# Patient Record
Sex: Female | Born: 1990 | Race: Black or African American | Hispanic: No | Marital: Single | Smoking: Former smoker
Health system: Southern US, Community
[De-identification: ages and names within clinical notes are randomized; demographics above are authoritative.]

## PROBLEM LIST (undated history)

## (undated) ENCOUNTER — Inpatient Hospital Stay (HOSPITAL_COMMUNITY): Payer: Self-pay

## (undated) DIAGNOSIS — N390 Urinary tract infection, site not specified: Secondary | ICD-10-CM

## (undated) DIAGNOSIS — L732 Hidradenitis suppurativa: Secondary | ICD-10-CM

## (undated) DIAGNOSIS — A599 Trichomoniasis, unspecified: Secondary | ICD-10-CM

## (undated) DIAGNOSIS — A749 Chlamydial infection, unspecified: Secondary | ICD-10-CM

## (undated) HISTORY — PX: WISDOM TOOTH EXTRACTION: SHX21

---
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
## 2009-04-09 ENCOUNTER — Emergency Department (HOSPITAL_COMMUNITY): Admission: EM | Admit: 2009-04-09 | Discharge: 2009-04-10 | Payer: Self-pay | Admitting: Emergency Medicine

## 2009-09-23 ENCOUNTER — Ambulatory Visit: Payer: Self-pay | Admitting: Gynecology

## 2009-09-25 ENCOUNTER — Inpatient Hospital Stay (HOSPITAL_COMMUNITY): Admission: AD | Admit: 2009-09-25 | Discharge: 2009-09-25 | Payer: Self-pay | Admitting: Obstetrics & Gynecology

## 2009-09-25 ENCOUNTER — Ambulatory Visit: Payer: Self-pay | Admitting: Gynecology

## 2010-02-11 ENCOUNTER — Inpatient Hospital Stay (HOSPITAL_COMMUNITY): Admission: AD | Admit: 2010-02-11 | Discharge: 2009-09-23 | Payer: Self-pay | Admitting: Family Medicine

## 2010-04-30 ENCOUNTER — Inpatient Hospital Stay (INDEPENDENT_AMBULATORY_CARE_PROVIDER_SITE_OTHER)
Admission: RE | Admit: 2010-04-30 | Discharge: 2010-04-30 | Disposition: A | Discharge status: Home / Self Care | Payer: PRIVATE HEALTH INSURANCE | Source: Ambulatory Visit | Attending: Family Medicine | Admitting: Family Medicine

## 2010-04-30 DIAGNOSIS — R300 Dysuria: Secondary | ICD-10-CM

## 2010-04-30 LAB — POCT PREGNANCY, URINE: Preg Test, Ur: NEGATIVE

## 2010-04-30 LAB — POCT URINALYSIS DIPSTICK
Protein, ur: 30 mg/dL — AB
Specific Gravity, Urine: >=1.03 (ref 1.005–1.030)
pH: 6 (ref 5.0–8.0)

## 2010-05-22 LAB — HCG, QUANTITATIVE, PREGNANCY: hCG, Beta Chain, Quant, S: 132 m[IU]/mL — ABNORMAL HIGH (ref <5)

## 2010-05-22 LAB — CBC
Hemoglobin: 13.4 g/dL (ref 12.0–15.0)
MCHC: 32.6 g/dL (ref 30.0–36.0)
Platelets: 223 10*3/uL (ref 150–400)
RDW: 14.3 % (ref 11.5–15.5)

## 2010-05-22 LAB — GC/CHLAMYDIA PROBE AMP, GENITAL: GC Probe Amp, Genital: NEGATIVE

## 2010-05-22 LAB — WET PREP, GENITAL: Trich, Wet Prep: NONE SEEN

## 2010-05-22 LAB — ABO/RH: ABO/RH(D): A POS

## 2011-02-07 ENCOUNTER — Inpatient Hospital Stay (HOSPITAL_COMMUNITY)
Admission: AD | Admit: 2011-02-07 | Discharge: 2011-02-07 | Disposition: A | Discharge status: Home / Self Care | Payer: PRIVATE HEALTH INSURANCE | Source: Ambulatory Visit | Attending: Obstetrics & Gynecology | Admitting: Obstetrics & Gynecology

## 2011-02-07 DIAGNOSIS — O99891 Other specified diseases and conditions complicating pregnancy: Secondary | ICD-10-CM | POA: Insufficient documentation

## 2011-02-07 DIAGNOSIS — Z3201 Encounter for pregnancy test, result positive: Secondary | ICD-10-CM

## 2011-02-07 NOTE — ED Provider Notes (Signed)
History     No chief complaint on file.  HPI Pt here requesting pregnancy verification only. No pain or bleeding. No c/o.    History  Substance Use Topics  . Smoking status: Not on file  . Smokeless tobacco: Not on file  . Alcohol Use: Not on file    Allergies: Allergies not on file  No prescriptions prior to admission    Review of Systems  Constitutional: Negative.   Respiratory: Negative.   Cardiovascular: Negative.   Gastrointestinal: Negative for nausea, vomiting, abdominal pain, diarrhea and constipation.  Genitourinary: Negative for dysuria, urgency, frequency, hematuria and flank pain.       Negative for vaginal bleeding, vaginal discharge, dyspareunia  Musculoskeletal: Negative.   Neurological: Negative.   Psychiatric/Behavioral: Negative.    Physical Exam   There were no vitals taken for this visit.  Physical Exam  Nursing note and vitals reviewed. Constitutional: She is oriented to person, place, and time. She appears well-developed.  Cardiovascular: Normal rate.   Respiratory: Effort normal.  Musculoskeletal: Normal range of motion.  Neurological: She is alert and oriented to person, place, and time.  Skin: Skin is warm and dry.  Psychiatric: She has a normal mood and affect.    MAU Course  Procedures  UPT positive  Assessment and Plan  Positive pregnancy test - verification given, start prenatal care ASAP, precautions rev'd  FRAZIER,NATALIE 02/07/2011, 7:33 PM

## 2011-02-16 NOTE — ED Provider Notes (Signed)
Agree with above note.  Christina Olsen H. 02/16/2011 12:44 PM

## 2011-02-26 ENCOUNTER — Encounter (HOSPITAL_COMMUNITY): Payer: Self-pay | Admitting: *Deleted

## 2011-02-26 ENCOUNTER — Inpatient Hospital Stay (HOSPITAL_COMMUNITY): Payer: PRIVATE HEALTH INSURANCE

## 2011-02-26 ENCOUNTER — Inpatient Hospital Stay (HOSPITAL_COMMUNITY)
Admission: AD | Admit: 2011-02-26 | Discharge: 2011-02-26 | Disposition: A | Discharge status: Home / Self Care | Payer: PRIVATE HEALTH INSURANCE | Source: Ambulatory Visit | Attending: Obstetrics and Gynecology | Admitting: Obstetrics and Gynecology

## 2011-02-26 DIAGNOSIS — O200 Threatened abortion: Secondary | ICD-10-CM

## 2011-02-26 LAB — ABO/RH: ABO/RH(D): A POS

## 2011-02-26 LAB — URINALYSIS, ROUTINE W REFLEX MICROSCOPIC
Glucose, UA: NEGATIVE mg/dL
Ketones, ur: NEGATIVE mg/dL
Leukocytes, UA: NEGATIVE
Nitrite: NEGATIVE

## 2011-02-26 LAB — URINE MICROSCOPIC-ADD ON

## 2011-02-26 LAB — CBC
MCH: 26.5 pg (ref 26.0–34.0)
MCHC: 34.2 g/dL (ref 30.0–36.0)
Platelets: 210 10*3/uL (ref 150–400)

## 2011-02-26 LAB — POCT PREGNANCY, URINE: Preg Test, Ur: POSITIVE

## 2011-02-26 LAB — HCG, QUANTITATIVE, PREGNANCY: hCG, Beta Chain, Quant, S: 9267 m[IU]/mL — ABNORMAL HIGH (ref <5)

## 2011-02-26 NOTE — Progress Notes (Signed)
Pt had pap smear on 12/20 and started spotting. Denies pain or discomfort.

## 2011-02-26 NOTE — ED Provider Notes (Signed)
History     CSN: [id number]  Arrival date & time 02/26/11  1627   None     Chief Complaint  Patient presents with  . Vaginal Bleeding    HPI Christina Olsen is a 20 y.o. female @ [redacted]w[redacted]d gestation who presents to MAU for vaginal bleeding. [facility] and had a pap smear and cultures done 2 days ago. Yesterday noted spotting and today the bleeding increased. She denies pain, nausea vomiting, UTI symptoms or other problems.  No past medical history on file.  No past surgical history on file.  Family History  Problem Relation Age of Onset  . Hypertension Mother     History  Substance Use Topics  . Smoking status: Former Games developer  . Smokeless tobacco: Not on file  . Alcohol Use: Yes     occasional mixed drinks     OB History    Grav Para Term Preterm Abortions TAB SAB Ect Mult Living   2    1  1          Review of Systems  Genitourinary: Positive for vaginal bleeding.       Pregnant   All other systems reviewed and are negative.    Allergies  Shellfish-derived products and Sulfa antibiotics  Home Medications  No current outpatient prescriptions on file.  BP 133/76  Pulse 88  Temp(Src) 98.1 F (36.7 C) (Oral)  Resp 18  Ht 5\' 6"  (1.676 m)  Wt 215 lb 9.6 oz (97.796 kg)  BMI 34.80 kg/m2  LMP 12/16/2010  Physical Exam  Nursing note and vitals reviewed. Constitutional: She is oriented to person, place, and time. She appears well-developed and well-nourished.  HENT:  Head: Normocephalic.  Eyes: EOM are normal.  Neck: Neck supple.  Cardiovascular: Normal rate.   Pulmonary/Chest: Effort normal.  Abdominal: Soft. There is no tenderness.  Genitourinary:       External genitalia without lesions. Small amount of blood vaginal vault. Cervix long, closed, no CMT, no adnexal tenderness or mass palpated. Uterus slightly enlarged.  Musculoskeletal: Normal range of motion.  Neurological: She is alert and  oriented to person, place, and time. No cranial nerve deficit.  Skin: Skin is warm and dry.  Psychiatric: She has a normal mood and affect. Her behavior is normal. Judgment and thought content normal.   Results for orders placed during the hospital encounter of 02/26/11 (from the past 24 hour(s))  URINALYSIS, ROUTINE W REFLEX MICROSCOPIC     Status: Abnormal   Collection Time   02/26/11  4:50 PM      Component Value Range   Color, Urine YELLOW  YELLOW    APPearance HAZY (*) CLEAR    Specific Gravity, Urine 1.025  1.005 - 1.030    pH 6.0  5.0 - 8.0    Glucose, UA NEGATIVE  NEGATIVE (mg/dL)   Hgb urine dipstick LARGE (*) NEGATIVE    Bilirubin Urine NEGATIVE  NEGATIVE    Ketones, ur NEGATIVE  NEGATIVE (mg/dL)   Protein, ur NEGATIVE  NEGATIVE (mg/dL)   Urobilinogen, UA 0.2  0.0 - 1.0 (mg/dL)   Nitrite NEGATIVE  NEGATIVE    Leukocytes, UA NEGATIVE  NEGATIVE   URINE MICROSCOPIC-ADD ON     Status: Abnormal   Collection Time   02/26/11  4:50 PM      Component Value Range   Squamous Epithelial / LPF FEW (*) RARE    WBC, UA 3-6  <3 (  WBC/hpf)   RBC / HPF TOO NUMEROUS TO COUNT  <3 (RBC/hpf)   Bacteria, UA FEW (*) RARE   POCT PREGNANCY, URINE     Status: Normal   Collection Time   02/26/11  5:00 PM      Component Value Range   Preg Test, Ur POSITIVE    HCG, QUANTITATIVE, PREGNANCY     Status: Abnormal   Collection Time   02/26/11  5:43 PM      Component Value Range   hCG, Beta Chain, Quant, S 9267 (*) <5 (mIU/mL)  ABO/RH     Status: Normal   Collection Time   02/26/11  5:43 PM      Component Value Range   ABO/RH(D) A POS    CBC     Status: Abnormal   Collection Time   02/26/11  5:43 PM      Component Value Range   WBC 13.6 (*) 4.0 - 10.5 (K/uL)   RBC 5.02  3.87 - 5.11 (MIL/uL)   Hemoglobin 13.3  12.0 - 15.0 (g/dL)   HCT 16.1  09.6 - 04.5 (%)   MCV 77.5 (*) 78.0 - 100.0 (fL)   MCH 26.5  26.0 - 34.0 (pg)   MCHC 34.2  30.0 - 36.0 (g/dL)   RDW 40.9  81.1 - 91.4 (%)   Platelets  210  150 - 400 (K/uL)   US Ob Comp Less 14 Wks  02/26/2011  *RADIOLOGY REPORT*  Clinical Data: bleeding,spotting; ;  OBSTETRIC <14 WK [place] AND TRANSVAGINAL OB US  Technique: Both transabdominal and transvaginal ultrasound examinations were performed for complete evaluation of the gestation as well as the maternal uterus, adnexal regions, and pelvic cul-de-sac.  Comparison: None.  Findings: Single intrauterine gestation, 6 weeks 6 days by crown- rump length.  No fetal heart tones can be detected.  No subchorionic hemorrhage.  Ovaries are symmetric in size and echotexture.  No adnexal masses.  IMPRESSION: 6-week-6-day intrauterine gestation.  However, no fetal tones can be detected.  Findings concerning for fetal demise.  Recommend close clinical follow-up as well as serial quantitative beta HCGs and ultrasounds.  Original Report Authenticated By: Cyndie Chime, M.D.   US Ob Transvaginal  02/26/2011  *RADIOLOGY REPORT*  Clinical Data: bleeding,spotting; ;  OBSTETRIC <14 WK [place] AND TRANSVAGINAL OB US  Technique: Both transabdominal and transvaginal ultrasound examinations were performed for complete evaluation of the gestation as well as the maternal uterus, adnexal regions, and pelvic cul-de-sac.  Comparison: None.  Findings: Single intrauterine gestation, 6 weeks 6 days by crown- rump length.  No fetal heart tones can be detected.  No subchorionic hemorrhage.  Ovaries are symmetric in size and echotexture.  No adnexal masses.  IMPRESSION: 6-week-6-day intrauterine gestation.  However, no fetal tones can be detected.  Findings concerning for fetal demise.  Recommend close clinical follow-up as well as serial quantitative beta HCGs and ultrasounds.  Original Report Authenticated By: Cyndie Chime, M.D.   ED Course  Procedures Assessment: IUP @ 6.[redacted] weeks gestation without cardiac activity possible failed pregnancy  Plan:  Consult with Dr. Jolayne Panther   Patient to return to [facility] for follow up ultrasound  for viability   May return here as needed for problems.   MDM          Kerrie Buffalo, NP 02/26/11 [phone]

## 2011-02-27 NOTE — ED Provider Notes (Signed)
Agree with above note.  Christina Olsen 02/27/2011 7:46 AM

## 2011-02-28 ENCOUNTER — Inpatient Hospital Stay (HOSPITAL_COMMUNITY): Payer: PRIVATE HEALTH INSURANCE

## 2011-02-28 ENCOUNTER — Inpatient Hospital Stay (HOSPITAL_COMMUNITY)
Admission: AD | Admit: 2011-02-28 | Discharge: 2011-02-28 | Disposition: A | Discharge status: Home / Self Care | Payer: PRIVATE HEALTH INSURANCE | Source: Ambulatory Visit | Attending: Obstetrics & Gynecology | Admitting: Obstetrics & Gynecology

## 2011-02-28 ENCOUNTER — Encounter (HOSPITAL_COMMUNITY): Payer: Self-pay | Admitting: *Deleted

## 2011-02-28 ENCOUNTER — Other Ambulatory Visit: Payer: Self-pay | Admitting: Obstetrics & Gynecology

## 2011-02-28 DIAGNOSIS — O039 Complete or unspecified spontaneous abortion without complication: Secondary | ICD-10-CM | POA: Insufficient documentation

## 2011-02-28 LAB — CBC
Platelets: 217 10*3/uL (ref 150–400)
RBC: 4.94 MIL/uL (ref 3.87–5.11)
RDW: 14.5 % (ref 11.5–15.5)
WBC: 13.8 10*3/uL — ABNORMAL HIGH (ref 4.0–10.5)

## 2011-02-28 MED ORDER — OXYCODONE-ACETAMINOPHEN 5-325 MG PO TABS
2.0000 | ORAL_TABLET | Freq: Once | ORAL | Status: AC
  Administered 2011-02-28: 2 via ORAL
  Filled 2011-02-28: qty 2

## 2011-02-28 MED ORDER — OXYCODONE-ACETAMINOPHEN 5-325 MG PO TABS: 1.0000 | ORAL_TABLET | ORAL | Status: AC | PRN

## 2011-02-28 NOTE — Progress Notes (Signed)
Pt states has been having vaginal bleeding for past 3 days-has gotten heavier-as much as a period

## 2011-02-28 NOTE — ED Provider Notes (Signed)
History   Pt presents today c/o heavy vag bleeding in preg. Pt was seen 2 days prior for spotting in preg. She states she began having heavy bleeding and pain this am. She denies recent intercourse. She denies vag irritation, fever, or any other sx at this time.  Chief Complaint  Patient presents with  . Vaginal Bleeding   HPI  OB History    Grav Para Term Preterm Abortions TAB SAB Ect Mult Living   2    1  1          No past medical history on file.  No past surgical history on file.  Family History  Problem Relation Age of Onset  . Hypertension Mother     History  Substance Use Topics  . Smoking status: Former Games developer  . Smokeless tobacco: Never Used  . Alcohol Use: Yes     occasional mixed drinks     Allergies:  Allergies  Allergen Reactions  . Shellfish-Derived Products Anaphylaxis and Swelling  . Sulfa Antibiotics Other (See Comments)    Reaction unknown; told by mother she was allergic    Prescriptions prior to admission  Medication Sig Dispense Refill  . acetaminophen (TYLENOL) 325 MG tablet Take 162.5 mg by mouth every 6 (six) hours as needed. For pain       . diphenhydrAMINE (BENYLIN) 12.5 MG/5ML syrup Take 12.5 mg by mouth 4 (four) times daily as needed. For cough cold       . hydrocortisone butyrate (LUCOID) 0.1 % CREA cream Apply 1 application topically 2 (two) times daily. For rash       . prenatal vitamin w/FE, FA (PRENATAL 1 + 1) 27-1 MG TABS Take 1 tablet by mouth daily.          Review of Systems  Constitutional: Negative for fever.  Cardiovascular: Negative for chest pain.  Gastrointestinal: Positive for abdominal pain. Negative for nausea, vomiting, diarrhea and constipation.  Genitourinary: Negative for dysuria, urgency, frequency and hematuria.  Neurological: Negative for dizziness and headaches.  Psychiatric/Behavioral: Negative for depression and suicidal ideas.   Physical Exam   Blood pressure 113/88, pulse 100, temperature 97.5 F (36.4  C), temperature source Oral, resp. rate 20, height 5\' 6"  (1.676 m), weight 217 lb (98.431 kg), last menstrual period 12/16/2010, SpO2 99.00%.  Physical Exam  Nursing note and vitals reviewed. Constitutional: She is oriented to person, place, and time. She appears well-developed and well-nourished. No distress.  HENT:  Head: Normocephalic and atraumatic.  Eyes: EOM are normal. Pupils are equal, round, and reactive to light.  GI: Soft. She exhibits no distension. There is tenderness. There is no rebound and no guarding.  Genitourinary: There is bleeding around the vagina. No vaginal discharge found.       Moderate vag bleeding noted. Golf-ball size clot with what appeared to be POC removed from vagina without difficulty.  Neurological: She is alert and oriented to person, place, and time.  Skin: Skin is warm and dry. She is not diaphoretic.  Psychiatric: She has a normal mood and affect. Her behavior is normal. Judgment and thought content normal.    MAU Course  Procedures  Results for orders placed during the hospital encounter of 02/28/11 (from the past 24 hour(s))  CBC     Status: Abnormal   Collection Time   02/28/11  5:50 AM      Component Value Range   WBC 13.8 (*) 4.0 - 10.5 (K/uL)   RBC 4.94  3.87 - 5.11 (  MIL/uL)   Hemoglobin 13.1  12.0 - 15.0 (g/dL)   HCT 16.1  09.6 - 04.5 (%)   MCV 78.3  78.0 - 100.0 (fL)   MCH 26.5  26.0 - 34.0 (pg)   MCHC 33.9  30.0 - 36.0 (g/dL)   RDW 40.9  81.1 - 91.4 (%)   Platelets 217  150 - 400 (K/uL)   US Ob Comp Less 14 Wks  02/26/2011  *RADIOLOGY REPORT*  Clinical Data: bleeding,spotting; ;  OBSTETRIC <14 WK [place] AND TRANSVAGINAL OB US  Technique: Both transabdominal and transvaginal ultrasound examinations were performed for complete evaluation of the gestation as well as the maternal uterus, adnexal regions, and pelvic cul-de-sac.  Comparison: None.  Findings: Single intrauterine gestation, 6 weeks 6 days by crown- rump length.  No fetal heart  tones can be detected.  No subchorionic hemorrhage.  Ovaries are symmetric in size and echotexture.  No adnexal masses.  IMPRESSION: 6-week-6-day intrauterine gestation.  However, no fetal tones can be detected.  Findings concerning for fetal demise.  Recommend close clinical follow-up as well as serial quantitative beta HCGs and ultrasounds.  Original Report Authenticated By: Cyndie Chime, M.D.   US Ob Transvaginal  02/28/2011  *RADIOLOGY REPORT*  Clinical Data: Vaginal bleeding; assess for retained products of conception.  TRANSVAGINAL OBSTETRIC US  Technique:  Transvaginal ultrasound was performed for complete evaluation of the gestation as well as the maternal uterus, adnexal regions, and pelvic cul-de-sac.  Comparison:  Prior ultrasound of pregnancy performed 02/26/2011  Intrauterine gestational sac: None seen. Yolk sac: N/A Embryo: N/A Cardiac Activity: N/A  Maternal uterus/adnexae: The previously noted intrauterine gestational sac is no longer seen.  There is diffuse thickening of the endometrial echo complex to 1.8 cm, reflecting blood within the endometrial canal.  Minimal associated color Doppler blood flow is seen, and minimal retained products of conception cannot be excluded, though this likely remains within normal limits.  No free fluid is seen within the pelvic cul-de-sac.  The ovaries are not well characterized on this study; they were normal in appearance on the recent prior study.  IMPRESSION: The previously noted intrauterine gestational sac is no longer seen.  Blood noted filling the endometrial canal; minimal associated color Doppler blood flow is seen, and minimal retained products of conception cannot be excluded, though this likely remains within normal limits.  Original Report Authenticated By: Tonia Ghent, M.D.   US Ob Transvaginal  02/26/2011  *RADIOLOGY REPORT*  Clinical Data: bleeding,spotting; ;  OBSTETRIC <14 WK [place] AND TRANSVAGINAL OB US  Technique: Both transabdominal and  transvaginal ultrasound examinations were performed for complete evaluation of the gestation as well as the maternal uterus, adnexal regions, and pelvic cul-de-sac.  Comparison: None.  Findings: Single intrauterine gestation, 6 weeks 6 days by crown- rump length.  No fetal heart tones can be detected.  No subchorionic hemorrhage.  Ovaries are symmetric in size and echotexture.  No adnexal masses.  IMPRESSION: 6-week-6-day intrauterine gestation.  However, no fetal tones can be detected.  Findings concerning for fetal demise.  Recommend close clinical follow-up as well as serial quantitative beta HCGs and ultrasounds.  Original Report Authenticated By: Cyndie Chime, M.D.     Assessment and Plan  Complete AB: discussed with pt at length. She will f/u with her OB provider. Discussed diet, activity, risks, and precautions.  Clinton Gallant. Rice III, DrHSc, MPAS, PA-C  02/28/2011, 5:34 AM   Henrietta Hoover, PA 02/28/11 [phone]

## 2012-03-16 ENCOUNTER — Encounter (HOSPITAL_COMMUNITY): Payer: Self-pay

## 2012-03-16 ENCOUNTER — Inpatient Hospital Stay (HOSPITAL_COMMUNITY): Payer: Medicaid Other

## 2012-03-16 ENCOUNTER — Inpatient Hospital Stay (HOSPITAL_COMMUNITY)
Admission: AD | Admit: 2012-03-16 | Discharge: 2012-03-16 | Disposition: A | Discharge status: Home / Self Care | Payer: Medicaid Other | Source: Ambulatory Visit | Attending: Obstetrics & Gynecology | Admitting: Obstetrics & Gynecology

## 2012-03-16 DIAGNOSIS — O209 Hemorrhage in early pregnancy, unspecified: Secondary | ICD-10-CM

## 2012-03-16 LAB — CBC WITH DIFFERENTIAL/PLATELET
Eosinophils Absolute: 0.1 10*3/uL (ref 0.0–0.7)
Eosinophils Relative: 1 % (ref 0–5)
HCT: 37.7 % (ref 36.0–46.0)
Hemoglobin: 13.3 g/dL (ref 12.0–15.0)
Lymphs Abs: 2 10*3/uL (ref 0.7–4.0)
MCH: 27 pg (ref 26.0–34.0)
MCHC: 35.3 g/dL (ref 30.0–36.0)
MCV: 76.6 fL — ABNORMAL LOW (ref 78.0–100.0)
Monocytes Absolute: 0.7 10*3/uL (ref 0.1–1.0)
Monocytes Relative: 6 % (ref 3–12)
Neutrophils Relative %: 78 % — ABNORMAL HIGH (ref 43–77)
RBC: 4.92 MIL/uL (ref 3.87–5.11)

## 2012-03-16 LAB — WET PREP, GENITAL: Yeast Wet Prep HPF POC: NONE SEEN

## 2012-03-16 LAB — POCT PREGNANCY, URINE: Preg Test, Ur: POSITIVE — AB

## 2012-03-16 NOTE — MAU Provider Note (Signed)
History     CSN: [id number]  Arrival date and time: 03/16/12 1840   First Provider Initiated Contact with Patient 03/16/12 1922      Chief Complaint  Patient presents with  . Vaginal Bleeding   HPI ADDELINE Olsen is 22 y.o. G3P0020 [redacted]w[redacted]d weeks presenting with report of seeing a spot of blood on her panties this am.  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  She has 1 partner.  Not using contraception.  + UPT here.  Denies pain or abnormal discharge.    No past medical history on file.  No past surgical history on file.  Family History  Problem Relation Age of Onset  . Hypertension Mother     History  Substance Use Topics  . Smoking status: Former Games developer  . Smokeless tobacco: Never Used  . Alcohol Use: No     Comment: occasional mixed drinks     Allergies:  Allergies  Allergen Reactions  . Shellfish-Derived Products Anaphylaxis and Swelling  . Sulfa Antibiotics Other (See Comments)    Reaction unknown; told by mother she was allergic    Prescriptions prior to admission  Medication Sig Dispense Refill  . Prenatal Vit-Fe Fumarate-FA (PRENATAL MULTIVITAMIN) TABS Take 1 tablet by mouth daily.        Review of Systems  Constitutional: Negative.   Respiratory: Negative.   Cardiovascular: Negative.   Gastrointestinal: Negative for nausea, vomiting and abdominal pain.  Genitourinary:       + for vaginal bleeding Neg for abnormal discharge   Physical Exam   Blood pressure 109/94, pulse 90, temperature 98.2 F (36.8 C), temperature source Oral, resp. rate 16, height 5\' 5"  (1.651 m), weight 188 lb 3.2 oz (85.367 kg), last menstrual period 01/27/2012, SpO2 100.00%, unknown if currently breastfeeding.  Physical Exam  Constitutional: She is oriented to person, place, and time. She appears well-developed and well-nourished. No distress.  HENT:  Head: Normocephalic.  Neck: Normal range of motion.    Cardiovascular: Normal rate.   Respiratory: Effort normal.  GI: Soft. She exhibits no distension and no mass. There is no tenderness. There is no rebound and no guarding.  Genitourinary: Uterus is not tender. Enlarged: slightly. Cervix exhibits no motion tenderness, no discharge and no friability. Right adnexum displays no mass, no tenderness and no fullness. Left adnexum displays no mass, no tenderness and no fullness. No bleeding (for active bleeding) around the vagina. Vaginal discharge (brown mucous discharge without odor) found.  Neurological: She is alert and oriented to person, place, and time.  Skin: Skin is warm and dry.  Psychiatric: She has a normal mood and affect. Her behavior is normal.   Results for orders placed during the hospital encounter of 03/16/12 (from the past 24 hour(s))  POCT PREGNANCY, URINE     Status: Abnormal   Collection Time   03/16/12  7:10 PM      Component Value Range   Preg Test, Ur POSITIVE (*) NEGATIVE  CBC WITH DIFFERENTIAL     Status: Abnormal   Collection Time   03/16/12  7:33 PM      Component Value Range   WBC 12.3 (*) 4.0 - 10.5 K/uL   RBC 4.92  3.87 - 5.11 MIL/uL   Hemoglobin 13.3  12.0 - 15.0 g/dL   HCT 11.9  14.7 - 82.9 %   MCV 76.6 (*) 78.0 - 100.0 fL   MCH 27.0  26.0 - 34.0 pg   MCHC 35.3  30.0 - 36.0 g/dL   RDW 11.9  14.7 - 82.9 %   Platelets 201  150 - 400 K/uL   Neutrophils Relative 78 (*) 43 - 77 %   Neutro Abs 9.6 (*) 1.7 - 7.7 K/uL   Lymphocytes Relative 16  12 - 46 %   Lymphs Abs 2.0  0.7 - 4.0 K/uL   Monocytes Relative 6  3 - 12 %   Monocytes Absolute 0.7  0.1 - 1.0 K/uL   Eosinophils Relative 1  0 - 5 %   Eosinophils Absolute 0.1  0.0 - 0.7 K/uL   Basophils Relative 0  0 - 1 %   Basophils Absolute 0.0  0.0 - 0.1 K/uL  WET PREP, GENITAL     Status: Abnormal   Collection Time   03/16/12  7:36 PM      Component Value Range   Yeast Wet Prep HPF POC NONE SEEN  NONE SEEN   Trich, Wet Prep NONE SEEN  NONE SEEN   Clue Cells  Wet Prep HPF POC NONE SEEN  NONE SEEN   WBC, Wet Prep HPF POC FEW (*) NONE SEEN    BLOOD TYPE FROM PREVIOUS RECORD IS A POSITIVE  MAU Course  Procedures  GC/CHL culture to lab  MDM Care turned over to J. Vernie Ammons, PA Assessment and Plan   [phone] - Care assumed from Jeani Sow, NP   Assessment: IUP at 7w 1 d Small subchorionic hemorrhage  Plan: Discharge home Bleeding precautions discussed Will refer patient to [facility] clinic at [facility] Patient may return to MAU as needed  Freddi Starr, PA-C 03/16/2012 8:46 PM

## 2012-03-16 NOTE — MAU Note (Signed)
Patient states she has had a [facility] test [facility]. Started having pink spotting around 1500. No pain,

## 2012-03-17 LAB — GC/CHLAMYDIA PROBE AMP
CT Probe RNA: NEGATIVE
GC Probe RNA: NEGATIVE

## 2012-03-25 ENCOUNTER — Inpatient Hospital Stay (HOSPITAL_COMMUNITY): Payer: Medicaid Other

## 2012-03-25 ENCOUNTER — Encounter (HOSPITAL_COMMUNITY): Payer: Self-pay | Admitting: Obstetrics and Gynecology

## 2012-03-25 ENCOUNTER — Inpatient Hospital Stay (HOSPITAL_COMMUNITY)
Admission: AD | Admit: 2012-03-25 | Discharge: 2012-03-25 | Disposition: A | Discharge status: Home / Self Care | Payer: Medicaid Other | Source: Ambulatory Visit | Attending: Obstetrics & Gynecology | Admitting: Obstetrics & Gynecology

## 2012-03-25 DIAGNOSIS — O4690 Antepartum hemorrhage, unspecified, unspecified trimester: Secondary | ICD-10-CM

## 2012-03-25 DIAGNOSIS — O209 Hemorrhage in early pregnancy, unspecified: Secondary | ICD-10-CM | POA: Insufficient documentation

## 2012-03-25 NOTE — MAU Provider Note (Signed)
  History     CSN: [id number]  Arrival date and time: 03/25/12 4098   First Provider Initiated Contact with Patient 03/25/12 [phone]      Chief Complaint  Patient presents with  . Vaginal Bleeding   HPI  Pt is [redacted]w[redacted]d pregnant and presents with dark brown spotting this morning after going to the bathroom.  Pt was evaluated on 1/10 for same thing and was noted to have mod [facility] with viable [redacted]w[redacted]d IUP.  Pt denies pain or cramping, vaginal discharge or UTI symptoms.  He spotting had stopped after her last visit and recurred this morning.  She last had IC 1 week ago.  History reviewed. No pertinent past medical history.  History reviewed. No pertinent past surgical history.  Family History  Problem Relation Age of Onset  . Hypertension Mother   . Bipolar disorder Mother   . Hypertension Maternal Aunt   . Bipolar disorder Maternal Aunt   . Schizophrenia Maternal Aunt   . OCD Maternal Aunt   . Hypertension Maternal Uncle   . Hypertension Maternal Grandmother     History  Substance Use Topics  . Smoking status: Former Games developer  . Smokeless tobacco: Never Used  . Alcohol Use: No     Comment: occasional mixed drinks     Allergies:  Allergies  Allergen Reactions  . Shellfish-Derived Products Anaphylaxis and Swelling  . Sulfa Antibiotics Other (See Comments)    Reaction unknown; told by mother she was allergic    Prescriptions prior to admission  Medication Sig Dispense Refill  . Prenatal Vit-Fe Fumarate-FA (PRENATAL MULTIVITAMIN) TABS Take 1 tablet by mouth daily.        Review of Systems  Constitutional: Negative for fever and chills.  Gastrointestinal: Negative for nausea and vomiting.  Genitourinary: Negative for dysuria and urgency.   Physical Exam   Blood pressure 126/66, pulse 75, temperature 98 F (36.7 C), temperature source Oral, resp. rate 18, height 5\' 6"  (1.676 m), weight 186 lb 12.8 oz (84.732 kg), last menstrual period 01/27/2012, not currently  breastfeeding.  Physical Exam  Vitals reviewed. Constitutional: She is oriented to person, place, and time. She appears well-developed and well-nourished. No distress.  HENT:  Head: Normocephalic.  Eyes: Pupils are equal, round, and reactive to light.  Neck: Normal range of motion. Neck supple.  Cardiovascular: Normal rate.   Respiratory: Effort normal.  GI: Soft. She exhibits no distension. There is no tenderness. There is no rebound.  Genitourinary:       Mod amount of thick brown discharge in vault; cervix closed, NT( deep in vagina difficult to visualize with speculum); uterus nontender 8 weeks size; adnexae without palpable enlargement or tenderness.    Musculoskeletal: Normal range of motion.  Neurological: She is alert and oriented to person, place, and time.  Skin: Skin is warm and dry.  Psychiatric: She has a normal mood and affect.    MAU Course  Procedures     Assessment and Plan  Bleeding during pregnancy F/u with OB care  LINEBERRY,SUSAN 03/25/2012, 10:51 AM

## 2012-03-25 NOTE — MAU Note (Signed)
"  At 0800 something this morning, after using the BR I noticed brownish spotting.  I feel completely fine and having no pain.  I was here a little while ago and was told that I had a subcutaneous hemorrhage.  I haven't had intercourse recently.  The last time was about 1 week ago."

## 2012-03-25 NOTE — MAU Note (Signed)
Pt states was seen 03/16/2012 for pregnancy/bleeding. Here today for brown spotting, last intercourse 1 week ago.

## 2012-03-26 NOTE — MAU Provider Note (Signed)
Attestation of Attending Supervision of Advanced Practitioner: Evaluation and management procedures were performed by the PA/NP/CNM/OB Fellow under my supervision/collaboration. Chart reviewed and agree with management and plan.  Lauriel Helin V 03/26/2012 10:23 PM    

## 2012-04-25 ENCOUNTER — Ambulatory Visit (INDEPENDENT_AMBULATORY_CARE_PROVIDER_SITE_OTHER): Payer: Medicaid Other | Admitting: Advanced Practice Midwife

## 2012-04-25 ENCOUNTER — Other Ambulatory Visit: Payer: Self-pay | Admitting: Advanced Practice Midwife

## 2012-04-25 ENCOUNTER — Encounter: Payer: Self-pay | Admitting: Advanced Practice Midwife

## 2012-04-25 VITALS — BP 145/73 | Temp 96.8°F | Wt 188.7 lb

## 2012-04-25 DIAGNOSIS — E669 Obesity, unspecified: Secondary | ICD-10-CM

## 2012-04-25 DIAGNOSIS — O99210 Obesity complicating pregnancy, unspecified trimester: Secondary | ICD-10-CM

## 2012-04-25 LAB — POCT URINALYSIS DIP (DEVICE)
Bilirubin Urine: NEGATIVE
Hgb urine dipstick: NEGATIVE
Nitrite: NEGATIVE
Protein, ur: NEGATIVE mg/dL
pH: 6 (ref 5.0–8.0)

## 2012-04-25 NOTE — Progress Notes (Signed)
Weight gain of 11-20lbs New ob packet given Flu vaccine given @ [facility]

## 2012-04-25 NOTE — Progress Notes (Signed)
NOB (see note). Wants first screen. Glucola today. Routines reviewed  Subjective:    Christina Olsen is a [id number] [redacted]w[redacted]d being seen today for her first obstetrical visit.  Her obstetrical history is significant for obesity. Patient does intend to breast feed. Pregnancy history fully reviewed.  Patient reports no complaints.  Filed Vitals:   04/25/12 0830  BP: 145/73  Temp: 96.8 F (36 C)  Weight: 188 lb 11.2 oz (85.594 kg)    HISTORY: OB History   Grav Para Term Preterm Abortions TAB SAB Ect Mult Living   3    2  2    0     # Outc Date GA Lbr Len/2nd Wgt Sex Del Anes PTL Lv   1 SAB 7/11 [redacted]w[redacted]d          2 SAB 12/12 [redacted]w[redacted]d          3 CUR              Past Medical History  Diagnosis Date  . Medical history non-contributory    History reviewed. No pertinent past surgical history. Family History  Problem Relation Age of Onset  . Hypertension Mother   . Bipolar disorder Mother   . Hypertension Maternal Aunt   . Bipolar disorder Maternal Aunt   . Schizophrenia Maternal Aunt   . OCD Maternal Aunt   . Hypertension Maternal Uncle   . Hypertension Maternal Grandmother      Exam    Uterus:  Fundal Height: 12 cm  Pelvic Exam:    Perineum: No Hemorrhoids   Vulva: Bartholin's, Urethra, Skene's normal   Vagina:  normal mucosa, normal discharge   pH:    Cervix: nulliparous appearance   Adnexa: normal adnexa and no mass, fullness, tenderness   Bony Pelvis: gynecoid  System: Breast:  normal appearance, no masses or tenderness   Skin: normal coloration and turgor, no rashes    Neurologic: oriented, normal   Extremities: normal strength, tone, and muscle mass   HEENT n/a   Mouth/Teeth mucous membranes moist, pharynx normal without lesions   Neck supple   Cardiovascular: regular rate and rhythm   Respiratory:  appears well, vitals normal, no respiratory distress, acyanotic, normal RR, ear and throat exam is normal, neck free of mass or lymphadenopathy, chest clear, no  wheezing, crepitations, rhonchi, normal symmetric air entry   Abdomen: soft, non-tender; bowel sounds normal; no masses,  no organomegaly   Urinary: urethral meatus normal      Assessment:    Pregnancy: [id number] Patient Active Problem List  Diagnosis  . Obesity complicating pregnancy, childbirth, or puerperium, antepartum   SIUP at [redacted]w[redacted]d       Plan:     Initial labs drawn. Prenatal vitamins. Problem list reviewed and updated. Genetic Screening discussed Integrated Screen: ordered.  Ultrasound discussed; fetal survey: ordered.  Follow up in 4 weeks. 50% of 30 min visit spent on counseling and coordination of care.  Glucola done.  Will repeat at 26-28 weeks   [facility] 04/25/2012

## 2012-04-25 NOTE — Patient Instructions (Addendum)
Pregnancy - Second Trimester The second trimester of pregnancy (3 to 6 months) is a period of rapid growth for you and your baby. At the end of the sixth month, your baby is about 9 inches long and weighs 1 1/2 pounds. You will begin to feel the baby move between 18 and 20 weeks of the pregnancy. This is called quickening. Weight gain is faster. A clear fluid (colostrum) may leak out of your breasts. You may feel small contractions of the womb (uterus). This is known as false labor or Braxton-Hicks contractions. This is like a practice for labor when the baby is ready to be born. Usually, the problems with morning sickness have usually passed by the end of your first trimester. Some women develop small dark blotches (called cholasma, mask of pregnancy) on their face that usually goes away after the baby is born. Exposure to the sun makes the blotches worse. Acne may also develop in some pregnant women and pregnant women who have acne, may find that it goes away. PRENATAL EXAMS  Blood work may continue to be done during prenatal exams. These tests are done to check on your health and the probable health of your baby. Blood work is used to follow your blood levels (hemoglobin). Anemia (low hemoglobin) is common during pregnancy. Iron and vitamins are given to help prevent this. You will also be checked for diabetes between 24 and 28 weeks of the pregnancy. Some of the previous blood tests may be repeated.  The size of the uterus is measured during each visit. This is to make sure that the baby is continuing to grow properly according to the dates of the pregnancy.  Your blood pressure is checked every prenatal visit. This is to make sure you are not getting toxemia.  Your urine is checked to make sure you do not have an infection, diabetes or protein in the urine.  Your weight is checked often to make sure gains are happening at the suggested rate. This is to ensure that both you and your baby are growing  normally.  Sometimes, an ultrasound is performed to confirm the proper growth and development of the baby. This is a test which bounces harmless sound waves off the baby so your caregiver can more accurately determine due dates. Sometimes, a specialized test is done on the amniotic fluid surrounding the baby. This test is called an amniocentesis. The amniotic fluid is obtained by sticking a needle into the belly (abdomen). This is done to check the chromosomes in instances where there is a concern about possible genetic problems with the baby. It is also sometimes done near the end of pregnancy if an early delivery is required. In this case, it is done to help make sure the baby's lungs are mature enough for the baby to live outside of the womb. CHANGES OCCURING IN THE SECOND TRIMESTER OF PREGNANCY Your body goes through many changes during pregnancy. They vary from person to person. Talk to your caregiver about changes you notice that you are concerned about.  During the second trimester, you will likely have an increase in your appetite. It is normal to have cravings for certain foods. This varies from person to person and pregnancy to pregnancy.  Your lower abdomen will begin to bulge.  You may have to urinate more often because the uterus and baby are pressing on your bladder. It is also common to get more bladder infections during pregnancy (pain with urination). You can help this by   drinking lots of fluids and emptying your bladder before and after intercourse.  You may begin to get stretch marks on your hips, abdomen, and breasts. These are normal changes in the body during pregnancy. There are no exercises or medications to take that prevent this change.  You may begin to develop swollen and bulging veins (varicose veins) in your legs. Wearing support hose, elevating your feet for 15 minutes, 3 to 4 times a day and limiting salt in your diet helps lessen the problem.  Heartburn may develop  as the uterus grows and pushes up against the stomach. Antacids recommended by your caregiver helps with this problem. Also, eating smaller meals 4 to 5 times a day helps.  Constipation can be treated with a stool softener or adding bulk to your diet. Drinking lots of fluids, vegetables, fruits, and whole grains are helpful.  Exercising is also helpful. If you have been very active up until your pregnancy, most of these activities can be continued during your pregnancy. If you have been less active, it is helpful to start an exercise program such as walking.  Hemorrhoids (varicose veins in the rectum) may develop at the end of the second trimester. Warm sitz baths and hemorrhoid cream recommended by your caregiver helps hemorrhoid problems.  Backaches may develop during this time of your pregnancy. Avoid heavy lifting, wear low heal shoes and practice good posture to help with backache problems.  Some pregnant women develop tingling and numbness of their hand and fingers because of swelling and tightening of ligaments in the wrist (carpel tunnel syndrome). This goes away after the baby is born.  As your breasts enlarge, you may have to get a bigger bra. Get a comfortable, cotton, support bra. Do not get a nursing bra until the last month of the pregnancy if you will be nursing the baby.  You may get a dark line from your belly button to the pubic area called the linea nigra.  You may develop rosy cheeks because of increase blood flow to the face.  You may develop spider looking lines of the face, neck, arms and chest. These go away after the baby is born. HOME CARE INSTRUCTIONS   It is extremely important to avoid all smoking, herbs, alcohol, and unprescribed drugs during your pregnancy. These chemicals affect the formation and growth of the baby. Avoid these chemicals throughout the pregnancy to ensure the delivery of a healthy infant.  Most of your home care instructions are the same as  suggested for the first trimester of your pregnancy. Keep your caregiver's appointments. Follow your caregiver's instructions regarding medication use, exercise and diet.  During pregnancy, you are providing food for you and your baby. Continue to eat regular, well-balanced meals. Choose foods such as meat, fish, milk and other low fat dairy products, vegetables, fruits, and whole-grain breads and cereals. Your caregiver will tell you of the ideal weight gain.  A physical sexual relationship may be continued up until near the end of pregnancy if there are no other problems. Problems could include early (premature) leaking of amniotic fluid from the membranes, vaginal bleeding, abdominal pain, or other medical or pregnancy problems.  Exercise regularly if there are no restrictions. Check with your caregiver if you are unsure of the safety of some of your exercises. The greatest weight gain will occur in the last 2 trimesters of pregnancy. Exercise will help you:  Control your weight.  Get you in shape for labor and delivery.  Lose weight   after you have the baby.  Wear a good support or jogging bra for breast tenderness during pregnancy. This may help if worn during sleep. Pads or tissues may be used in the bra if you are leaking colostrum.  Do not use hot tubs, steam rooms or saunas throughout the pregnancy.  Wear your seat belt at all times when driving. This protects you and your baby if you are in an accident.  Avoid raw meat, uncooked cheese, cat litter boxes and soil used by cats. These carry germs that can cause birth defects in the baby.  The second trimester is also a good time to visit your dentist for your dental health if this has not been done yet. Getting your teeth cleaned is OK. Use a soft toothbrush. Brush gently during pregnancy.  It is easier to loose urine during pregnancy. Tightening up and strengthening the pelvic muscles will help with this problem. Practice stopping your  urination while you are going to the bathroom. These are the same muscles you need to strengthen. It is also the muscles you would use as if you were trying to stop from passing gas. You can practice tightening these muscles up 10 times a set and repeating this about 3 times per day. Once you know what muscles to tighten up, do not perform these exercises during urination. It is more likely to contribute to an infection by backing up the urine.  Ask for help if you have financial, counseling or nutritional needs during pregnancy. Your caregiver will be able to offer counseling for these needs as well as refer you for other special needs.  Your skin may become oily. If so, wash your face with mild soap, use non-greasy moisturizer and oil or cream based makeup. MEDICATIONS AND DRUG USE IN PREGNANCY  Take prenatal vitamins as directed. The vitamin should contain 1 milligram of folic acid. Keep all vitamins out of reach of children. Only a couple vitamins or tablets containing iron may be fatal to a baby or young child when ingested.  Avoid use of all medications, including herbs, over-the-counter medications, not prescribed or suggested by your caregiver. Only take over-the-counter or prescription medicines for pain, discomfort, or fever as directed by your caregiver. Do not use aspirin.  Let your caregiver also know about herbs you may be using.  Alcohol is related to a number of birth defects. This includes fetal alcohol syndrome. All alcohol, in any form, should be avoided completely. Smoking will cause low birth rate and premature babies.  Street or illegal drugs are very harmful to the baby. They are absolutely forbidden. A baby born to an addicted mother will be addicted at birth. The baby will go through the same withdrawal an adult does. SEEK MEDICAL CARE IF:  You have any concerns or worries during your pregnancy. It is better to call with your questions if you feel they cannot wait, rather  than worry about them. SEEK IMMEDIATE MEDICAL CARE IF:   An unexplained oral temperature above 102 F (38.9 C) develops, or as your caregiver suggests.  You have leaking of fluid from the vagina (birth canal). If leaking membranes are suspected, take your temperature and tell your caregiver of this when you call.  There is vaginal spotting, bleeding, or passing clots. Tell your caregiver of the amount and how many pads are used. Light spotting in pregnancy is common, especially following intercourse.  You develop a bad smelling vaginal discharge with a change in the color from clear   to white.  You continue to feel sick to your stomach (nauseated) and have no relief from remedies suggested. You vomit blood or coffee ground-like materials.  You lose more than 2 pounds of weight or gain more than 2 pounds of weight over 1 week, or as suggested by your caregiver.  You notice swelling of your face, hands, feet, or legs.  You get exposed to German measles and have never had them.  You are exposed to fifth disease or chickenpox.  You develop belly (abdominal) pain. Round ligament discomfort is a common non-cancerous (benign) cause of abdominal pain in pregnancy. Your caregiver still must evaluate you.  You develop a bad headache that does not go away.  You develop fever, diarrhea, pain with urination, or shortness of breath.  You develop visual problems, blurry, or double vision.  You fall or are in a car accident or any kind of trauma.  There is mental or physical violence at home. Document Released: 02/15/2001 Document Revised: 05/16/2011 Document Reviewed: 08/20/2008 [employer] Patient Information 2013 [employer].  

## 2012-04-26 LAB — OBSTETRIC PANEL
Antibody Screen: NEGATIVE
Eosinophils Absolute: 0.1 10*3/uL (ref 0.0–0.7)
HCT: 39.8 % (ref 36.0–46.0)
Hemoglobin: 13.4 g/dL (ref 12.0–15.0)
Hepatitis B Surface Ag: NEGATIVE
Lymphs Abs: 1.4 10*3/uL (ref 0.7–4.0)
MCH: 26.6 pg (ref 26.0–34.0)
MCHC: 33.7 g/dL (ref 30.0–36.0)
MCV: 79 fL (ref 78.0–100.0)
Monocytes Absolute: 0.5 10*3/uL (ref 0.1–1.0)
Monocytes Relative: 5 % (ref 3–12)
Neutrophils Relative %: 80 % — ABNORMAL HIGH (ref 43–77)
RBC: 5.04 MIL/uL (ref 3.87–5.11)
Rh Type: POSITIVE

## 2012-04-26 LAB — GLUCOSE TOLERANCE, 1 HOUR (50G) W/O FASTING: Glucose, 1 Hour GTT: 71 mg/dL (ref 70–140)

## 2012-04-27 LAB — CULTURE, OB URINE

## 2012-04-30 ENCOUNTER — Ambulatory Visit (HOSPITAL_COMMUNITY)
Admission: RE | Admit: 2012-04-30 | Discharge: 2012-04-30 | Disposition: A | Discharge status: Home / Self Care | Payer: Medicaid Other | Source: Ambulatory Visit | Attending: Advanced Practice Midwife | Admitting: Advanced Practice Midwife

## 2012-04-30 ENCOUNTER — Encounter (HOSPITAL_COMMUNITY): Payer: Self-pay

## 2012-04-30 ENCOUNTER — Other Ambulatory Visit: Payer: Self-pay

## 2012-04-30 ENCOUNTER — Encounter: Payer: Self-pay | Admitting: Advanced Practice Midwife

## 2012-04-30 DIAGNOSIS — Z3689 Encounter for other specified antenatal screening: Secondary | ICD-10-CM | POA: Insufficient documentation

## 2012-04-30 DIAGNOSIS — O3510X0 Maternal care for (suspected) chromosomal abnormality in fetus, unspecified, not applicable or unspecified: Secondary | ICD-10-CM | POA: Insufficient documentation

## 2012-04-30 DIAGNOSIS — O99210 Obesity complicating pregnancy, unspecified trimester: Secondary | ICD-10-CM

## 2012-04-30 DIAGNOSIS — O351XX0 Maternal care for (suspected) chromosomal abnormality in fetus, not applicable or unspecified: Secondary | ICD-10-CM | POA: Insufficient documentation

## 2012-05-01 ENCOUNTER — Encounter: Payer: Self-pay | Admitting: Advanced Practice Midwife

## 2012-05-01 ENCOUNTER — Encounter: Payer: Self-pay | Admitting: Obstetrics and Gynecology

## 2012-05-01 LAB — HEMOGLOBINOPATHY EVALUATION
Hemoglobin Other: 0 %
Hgb S Quant: 0 %

## 2012-05-08 ENCOUNTER — Telehealth: Payer: Self-pay | Admitting: *Deleted

## 2012-05-08 NOTE — Telephone Encounter (Addendum)
Pt left message requesting test results from her last visit.  I returned pt's call and informed her of normal test results except that she has sickle cell trait. Pt was surprised @ this news. Pt stated that she has previously tested negative for sickle cell trait. She has a sister who is positive for the trait. Pt was advised that the FOB needs to be tested and recommendation was made that he have the test performed prior to Christina Olsen's next appt on 05/23/12. Pt voiced understanding of all information given.

## 2012-05-23 ENCOUNTER — Other Ambulatory Visit: Payer: Self-pay | Admitting: Obstetrics and Gynecology

## 2012-05-23 ENCOUNTER — Encounter: Payer: Self-pay | Admitting: Family Medicine

## 2012-05-23 ENCOUNTER — Ambulatory Visit (INDEPENDENT_AMBULATORY_CARE_PROVIDER_SITE_OTHER): Payer: Medicaid Other | Admitting: Advanced Practice Midwife

## 2012-05-23 VITALS — BP 125/77 | Wt 193.9 lb

## 2012-05-23 DIAGNOSIS — D573 Sickle-cell trait: Secondary | ICD-10-CM

## 2012-05-23 DIAGNOSIS — Z3480 Encounter for supervision of other normal pregnancy, unspecified trimester: Secondary | ICD-10-CM

## 2012-05-23 DIAGNOSIS — Z1379 Encounter for other screening for genetic and chromosomal anomalies: Secondary | ICD-10-CM

## 2012-05-23 DIAGNOSIS — D582 Other hemoglobinopathies: Secondary | ICD-10-CM

## 2012-05-23 DIAGNOSIS — B373 Candidiasis of vulva and vagina: Secondary | ICD-10-CM

## 2012-05-23 DIAGNOSIS — Z3492 Encounter for supervision of normal pregnancy, unspecified, second trimester: Secondary | ICD-10-CM

## 2012-05-23 DIAGNOSIS — O99210 Obesity complicating pregnancy, unspecified trimester: Secondary | ICD-10-CM

## 2012-05-23 DIAGNOSIS — Z3482 Encounter for supervision of other normal pregnancy, second trimester: Secondary | ICD-10-CM

## 2012-05-23 DIAGNOSIS — O99212 Obesity complicating pregnancy, second trimester: Secondary | ICD-10-CM

## 2012-05-23 DIAGNOSIS — D564 Hereditary persistence of fetal hemoglobin [HPFH]: Secondary | ICD-10-CM

## 2012-05-23 LAB — POCT URINALYSIS DIP (DEVICE)
Bilirubin Urine: NEGATIVE
Ketones, ur: NEGATIVE mg/dL
Protein, ur: NEGATIVE mg/dL
Specific Gravity, Urine: >=1.03 (ref 1.005–1.030)
pH: 5.5 (ref 5.0–8.0)

## 2012-05-23 MED ORDER — FLUCONAZOLE 150 MG PO TABS: 150.0000 mg | ORAL_TABLET | Freq: Once | ORAL | Status: DC

## 2012-05-23 NOTE — Progress Notes (Signed)
Reviewed NOB labs. HGB elec shows persistent fetal hemoglobin--NOT sickle cell trait. No intervention need for pt or pregnancy per Dr. Shawnie Pons. AFP drawn.

## 2012-05-23 NOTE — Progress Notes (Signed)
Pulse: 85

## 2012-05-23 NOTE — Patient Instructions (Addendum)
Persistent Fetal Hemoglobin  Pregnancy - Second Trimester The second trimester of pregnancy (3 to 6 months) is a period of rapid growth for you and your baby. At the end of the sixth month, your baby is about 9 inches long and weighs 1 1/2 pounds. You will begin to feel the baby move between 18 and 20 weeks of the pregnancy. This is called quickening. Weight gain is faster. A clear fluid (colostrum) may leak out of your breasts. You may feel small contractions of the womb (uterus). This is known as false labor or Braxton-Hicks contractions. This is like a practice for labor when the baby is ready to be born. Usually, the problems with morning sickness have usually passed by the end of your first trimester. Some women develop small dark blotches (called cholasma, mask of pregnancy) on their face that usually goes away after the baby is born. Exposure to the sun makes the blotches worse. Acne may also develop in some pregnant women and pregnant women who have acne, may find that it goes away. PRENATAL EXAMS  Blood work may continue to be done during prenatal exams. These tests are done to check on your health and the probable health of your baby. Blood work is used to follow your blood levels (hemoglobin). Anemia (low hemoglobin) is common during pregnancy. Iron and vitamins are given to help prevent this. You will also be checked for diabetes between 24 and 28 weeks of the pregnancy. Some of the previous blood tests may be repeated.  The size of the uterus is measured during each visit. This is to make sure that the baby is continuing to grow properly according to the dates of the pregnancy.  Your blood pressure is checked every prenatal visit. This is to make sure you are not getting toxemia.  Your urine is checked to make sure you do not have an infection, diabetes or protein in the urine.  Your weight is checked often to make sure gains are happening at the suggested rate. This is to ensure that both  you and your baby are growing normally.  Sometimes, an ultrasound is performed to confirm the proper growth and development of the baby. This is a test which bounces harmless sound waves off the baby so your caregiver can more accurately determine due dates. Sometimes, a specialized test is done on the amniotic fluid surrounding the baby. This test is called an amniocentesis. The amniotic fluid is obtained by sticking a needle into the belly (abdomen). This is done to check the chromosomes in instances where there is a concern about possible genetic problems with the baby. It is also sometimes done near the end of pregnancy if an early delivery is required. In this case, it is done to help make sure the baby's lungs are mature enough for the baby to live outside of the womb. CHANGES OCCURING IN THE SECOND TRIMESTER OF PREGNANCY Your body goes through many changes during pregnancy. They vary from person to person. Talk to your caregiver about changes you notice that you are concerned about.  During the second trimester, you will likely have an increase in your appetite. It is normal to have cravings for certain foods. This varies from person to person and pregnancy to pregnancy.  Your lower abdomen will begin to bulge.  You may have to urinate more often because the uterus and baby are pressing on your bladder. It is also common to get more bladder infections during pregnancy (pain with urination). You  can help this by drinking lots of fluids and emptying your bladder before and after intercourse.  You may begin to get stretch marks on your hips, abdomen, and breasts. These are normal changes in the body during pregnancy. There are no exercises or medications to take that prevent this change.  You may begin to develop swollen and bulging veins (varicose veins) in your legs. Wearing support hose, elevating your feet for 15 minutes, 3 to 4 times a day and limiting salt in your diet helps lessen the  problem.  Heartburn may develop as the uterus grows and pushes up against the stomach. Antacids recommended by your caregiver helps with this problem. Also, eating smaller meals 4 to 5 times a day helps.  Constipation can be treated with a stool softener or adding bulk to your diet. Drinking lots of fluids, vegetables, fruits, and whole grains are helpful.  Exercising is also helpful. If you have been very active up until your pregnancy, most of these activities can be continued during your pregnancy. If you have been less active, it is helpful to start an exercise program such as walking.  Hemorrhoids (varicose veins in the rectum) may develop at the end of the second trimester. Warm sitz baths and hemorrhoid cream recommended by your caregiver helps hemorrhoid problems.  Backaches may develop during this time of your pregnancy. Avoid heavy lifting, wear low heal shoes and practice good posture to help with backache problems.  Some pregnant women develop tingling and numbness of their hand and fingers because of swelling and tightening of ligaments in the wrist (carpel tunnel syndrome). This goes away after the baby is born.  As your breasts enlarge, you may have to get a bigger bra. Get a comfortable, cotton, support bra. Do not get a nursing bra until the last month of the pregnancy if you will be nursing the baby.  You may get a dark line from your belly button to the pubic area called the linea nigra.  You may develop rosy cheeks because of increase blood flow to the face.  You may develop spider looking lines of the face, neck, arms and chest. These go away after the baby is born. HOME CARE INSTRUCTIONS   It is extremely important to avoid all smoking, herbs, alcohol, and unprescribed drugs during your pregnancy. These chemicals affect the formation and growth of the baby. Avoid these chemicals throughout the pregnancy to ensure the delivery of a healthy infant.  Most of your home care  instructions are the same as suggested for the first trimester of your pregnancy. Keep your caregiver's appointments. Follow your caregiver's instructions regarding medication use, exercise and diet.  During pregnancy, you are providing food for you and your baby. Continue to eat regular, well-balanced meals. Choose foods such as meat, fish, milk and other low fat dairy products, vegetables, fruits, and whole-grain breads and cereals. Your caregiver will tell you of the ideal weight gain.  A physical sexual relationship may be continued up until near the end of pregnancy if there are no other problems. Problems could include early (premature) leaking of amniotic fluid from the membranes, vaginal bleeding, abdominal pain, or other medical or pregnancy problems.  Exercise regularly if there are no restrictions. Check with your caregiver if you are unsure of the safety of some of your exercises. The greatest weight gain will occur in the last 2 trimesters of pregnancy. Exercise will help you:  Control your weight.  Get you in shape for labor and  delivery.  Lose weight after you have the baby.  Wear a good support or jogging bra for breast tenderness during pregnancy. This may help if worn during sleep. Pads or tissues may be used in the bra if you are leaking colostrum.  Do not use hot tubs, steam rooms or saunas throughout the pregnancy.  Wear your seat belt at all times when driving. This protects you and your baby if you are in an accident.  Avoid raw meat, uncooked cheese, cat litter boxes and soil used by cats. These carry germs that can cause birth defects in the baby.  The second trimester is also a good time to visit your dentist for your dental health if this has not been done yet. Getting your teeth cleaned is OK. Use a soft toothbrush. Brush gently during pregnancy.  It is easier to loose urine during pregnancy. Tightening up and strengthening the pelvic muscles will help with this  problem. Practice stopping your urination while you are going to the bathroom. These are the same muscles you need to strengthen. It is also the muscles you would use as if you were trying to stop from passing gas. You can practice tightening these muscles up 10 times a set and repeating this about 3 times per day. Once you know what muscles to tighten up, do not perform these exercises during urination. It is more likely to contribute to an infection by backing up the urine.  Ask for help if you have financial, counseling or nutritional needs during pregnancy. Your caregiver will be able to offer counseling for these needs as well as refer you for other special needs.  Your skin may become oily. If so, wash your face with mild soap, use non-greasy moisturizer and oil or cream based makeup. MEDICATIONS AND DRUG USE IN PREGNANCY  Take prenatal vitamins as directed. The vitamin should contain 1 milligram of folic acid. Keep all vitamins out of reach of children. Only a couple vitamins or tablets containing iron may be fatal to a baby or young child when ingested.  Avoid use of all medications, including herbs, over-the-counter medications, not prescribed or suggested by your caregiver. Only take over-the-counter or prescription medicines for pain, discomfort, or fever as directed by your caregiver. Do not use aspirin.  Let your caregiver also know about herbs you may be using.  Alcohol is related to a number of birth defects. This includes fetal alcohol syndrome. All alcohol, in any form, should be avoided completely. Smoking will cause low birth rate and premature babies.  Street or illegal drugs are very harmful to the baby. They are absolutely forbidden. A baby born to an addicted mother will be addicted at birth. The baby will go through the same withdrawal an adult does. SEEK MEDICAL CARE IF:  You have any concerns or worries during your pregnancy. It is better to call with your questions if you  feel they cannot wait, rather than worry about them. SEEK IMMEDIATE MEDICAL CARE IF:   An unexplained oral temperature above 102 F (38.9 C) develops, or as your caregiver suggests.  You have leaking of fluid from the vagina (birth canal). If leaking membranes are suspected, take your temperature and tell your caregiver of this when you call.  There is vaginal spotting, bleeding, or passing clots. Tell your caregiver of the amount and how many pads are used. Light spotting in pregnancy is common, especially following intercourse.  You develop a bad smelling vaginal discharge with a change in  the color from clear to white.  You continue to feel sick to your stomach (nauseated) and have no relief from remedies suggested. You vomit blood or coffee ground-like materials.  You lose more than 2 pounds of weight or gain more than 2 pounds of weight over 1 week, or as suggested by your caregiver.  You notice swelling of your face, hands, feet, or legs.  You get exposed to [place] measles and have never had them.  You are exposed to fifth disease or chickenpox.  You develop belly (abdominal) pain. Round ligament discomfort is a common non-cancerous (benign) cause of abdominal pain in pregnancy. Your caregiver still must evaluate you.  You develop a bad headache that does not go away.  You develop fever, diarrhea, pain with urination, or shortness of breath.  You develop visual problems, blurry, or double vision.  You fall or are in a car accident or any kind of trauma.  There is mental or physical violence at home. Document Released: 02/15/2001 Document Revised: 05/16/2011 Document Reviewed: 08/20/2008 [facility] Patient Information 2013 [place], [place].  Medicines During Pregnancy During pregnancy, there are medicines that are either safe or unsafe to take. Medicines include prescriptions from your caregiver, over-the-counter medicines, topical creams applied to the skin, and all herbal  substances. Medicines are put into either Class A, B, C, or D. Class A and B medicines have been shown to be safe in pregnancy. Class C medicines are also considered to be safe in pregnancy, but these medicines should only be used when necessary. Class D medicines should not be used at all in pregnancy. They can be harmful to a baby.  It is best to take as little medicine as possible while pregnant. However, some medicines are necessary to take for the mother and baby's health. Sometimes, it is more dangerous to stop taking certain medicines than to stay on them. This is often the case for people with long-term (chronic) conditions such as asthma, diabetes, or high blood pressure (hypertension). If you are pregnant and have a chronic illness, call your caregiver right away. Bring a list of your medicines and their doses to your appointments. If you are planning to become pregnant, schedule a doctor's appointment and discuss your medicines with your caregiver. Lastly, write down the phone number to your pharmacist. They can answer questions regarding a medicine's class and safety. They cannot give advice as to whether you should or should not be on a medicine.  SAFE AND UNSAFE MEDICINES There is a long list of medicines that are considered safe in pregnancy. Below is a shorter list. For specific medicines, ask your caregiver.  AllergyMedicines Loratadine, cetirizine, and chlorpheniramine are safe to take. Certain nasal steroid sprays are safe. Talk to your caregiver about specific brands that are safe. Analgesics Acetaminophen and acetaminophen with codeine are safe to take. All other nonsteroidal anti-inflammatory drugs (NSAIDS) are not safe. This includes ibuprofen.  Antacids All over-the-counter antacids are safe to take. Talk to your caregiver about specific brands that are safe. Tums, Famotidine, ranitidine, and lansoprazole are safe. Omepresole is considered safe to take in the second  trimester. Antibiotic Medicines There are several antibiotics to avoid. These include, but are not limited to, tetracyline, quinolones, and sulfa medications. Talk to your caregiver before taking any antibiotic.  Antihistamines Benadryl, Zyrtec, Claritin Asthma Medicines Most asthma steroid inhalers are safe to take. Talk to your caregiver for specific details. Calcium Calcium supplements are safe to take. Do not take oyster shell calcium.  Cough and Cold Medicines It is safe to take products with guaifenesin or dextromethorphan. Talk to your caregiver about specific brands that are safe. It is not safe to take products that contain aspirin or ibuprofen. Decongestant Medicines Pseudoephedrine-containing products are safe to take in the second and third trimester.  Depression Medicines Talk about these medicines with your caregiver.  Antidiarrheal Medicines It is safe to take loperamide. Talk to your caregiver about specific brands that are safe. It is not safe to take any antidiarrheal medicine that contains bismuth. Eyedrops Allergy eyedrops should be limited.  Iron It is safe to use certain iron-containing medicines for anemia in pregnancy. They require a prescription.  Antinausea Medicines It is safe to take doxylamine and vitamin B6 as directed. There are other prescription medicines available, if needed.  Sleep aids It is safe to take diphenhydramine and acetaminophen with diphenhydramine.  Steroids Hydrocortisone creams are safe to use as directed. Oral steroids require a prescription. It is not safe to take any hemorrhoid cream with pramoxine or phenylephrine. Stool softener It is safe to take stool softener medicines. Avoid daily or prolonged use of stool softeners. Colace. Thyroid Medicine It is important to stay on this thyroid medicine. It needs to be followed by your caregiver.  Vaginal Medicines Your caregiver will prescribe a medicine to you if you have a  vaginal infection. Certain antifungal medicines are safe to use if you have a sexually transmitted infection (STI). Talk to your caregiver.  Document Released: 02/21/2005 Document Revised: 05/16/2011 Document Reviewed: 02/22/2011 [facility] Patient Information 2013 [place], [place].

## 2012-05-24 DIAGNOSIS — Z3480 Encounter for supervision of other normal pregnancy, unspecified trimester: Secondary | ICD-10-CM | POA: Insufficient documentation

## 2012-05-24 DIAGNOSIS — D564 Hereditary persistence of fetal hemoglobin [HPFH]: Secondary | ICD-10-CM | POA: Insufficient documentation

## 2012-05-26 ENCOUNTER — Encounter (HOSPITAL_COMMUNITY): Payer: Self-pay | Admitting: *Deleted

## 2012-05-26 ENCOUNTER — Inpatient Hospital Stay (HOSPITAL_COMMUNITY)
Admission: AD | Admit: 2012-05-26 | Discharge: 2012-05-26 | Disposition: A | Discharge status: Home / Self Care | Payer: Medicaid Other | Source: Ambulatory Visit | Attending: Obstetrics & Gynecology | Admitting: Obstetrics & Gynecology

## 2012-05-26 DIAGNOSIS — Z3482 Encounter for supervision of other normal pregnancy, second trimester: Secondary | ICD-10-CM

## 2012-05-26 DIAGNOSIS — O99212 Obesity complicating pregnancy, second trimester: Secondary | ICD-10-CM

## 2012-05-26 DIAGNOSIS — O99891 Other specified diseases and conditions complicating pregnancy: Secondary | ICD-10-CM | POA: Insufficient documentation

## 2012-05-26 DIAGNOSIS — R509 Fever, unspecified: Secondary | ICD-10-CM | POA: Insufficient documentation

## 2012-05-26 NOTE — MAU Note (Signed)
Pt reports she has been "stressed out" and when she woke up from her nap she felt like she was burning up. Did not check her temp does not have a thermometer did not take any tylenol Is a pt at the [facility] downstairs.

## 2012-05-26 NOTE — MAU Provider Note (Signed)
Chief Complaint: Fever   None    SUBJECTIVE HPI: Christina Olsen is a 22 y.o. G3P0020 at [redacted]w[redacted]d who told triage nurse she was stressed about her cousin being murdered yesterday and woke up feeling feverish. I did not obtain any hx before pt. left MAU.   Past Medical History  Diagnosis Date  . Medical history non-contributory    OB History   Grav Para Term Preterm Abortions TAB SAB Ect Mult Living   3    2  2    0     # Outc Date GA Lbr Len/2nd Wgt Sex Del Anes PTL Lv   1 SAB 7/11 [redacted]w[redacted]d          2 SAB 12/12 [redacted]w[redacted]d          3 CUR              Past Surgical History  Procedure Laterality Date  . No past surgeries     History   Social History  . Marital Status: Single    Spouse Name: N/A    Number of Children: N/A  . Years of Education: N/A   Occupational History  . Not on file.   Social History Main Topics  . Smoking status: Former Games developer  . Smokeless tobacco: Never Used  . Alcohol Use: No     Comment: occasional mixed drinks   . Drug Use: No  . Sexually Active: Yes     Comment: last intercourse 1 week ago   Other Topics Concern  . Not on file   Social History Narrative  . No narrative on file   No current facility-administered medications on file prior to encounter.   Current Outpatient Prescriptions on File Prior to Encounter  Medication Sig Dispense Refill  . Prenatal Vit-Fe Fumarate-FA (PRENATAL MULTIVITAMIN) TABS Take 1 tablet by mouth daily.      . fluconazole (DIFLUCAN) 150 MG tablet Take 1 tablet (150 mg total) by mouth once. If no relief 3 days after first dose take second dose.  1 tablet  0   Allergies  Allergen Reactions  . Shellfish-Derived Products Anaphylaxis and Swelling  . Sulfa Antibiotics Other (See Comments)    Reaction unknown; told by mother she was allergic      OBJECTIVE Blood pressure 116/65, pulse 81, temperature 98.6 F (37 C), temperature source Oral, resp. rate 18, height 5\' 6"  (1.676 m), weight 88.905 kg (196 lb), last  menstrual period 01/27/2012. GENERAL: Well-developed, well-nourished female in no acute distress per RN report DT FHR in Triage 159  MAU COURSE Left shortly after arriving in room  ASSESSMENT/ PLAN None : Left after triage without being seen.       Danae Orleans, CNM 05/26/2012  7:20 PM

## 2012-05-26 NOTE — MAU Note (Signed)
Pt left the unit without telling anyone. Did not sign out. D Poe CNM notified.

## 2012-05-27 ENCOUNTER — Inpatient Hospital Stay (HOSPITAL_COMMUNITY)
Admission: AD | Admit: 2012-05-27 | Discharge: 2012-05-27 | Disposition: A | Discharge status: Home / Self Care | Payer: Medicaid Other | Source: Ambulatory Visit | Attending: Obstetrics and Gynecology | Admitting: Obstetrics and Gynecology

## 2012-05-27 ENCOUNTER — Encounter (HOSPITAL_COMMUNITY): Payer: Self-pay | Admitting: Family

## 2012-05-27 DIAGNOSIS — Z3482 Encounter for supervision of other normal pregnancy, second trimester: Secondary | ICD-10-CM

## 2012-05-27 DIAGNOSIS — R1084 Generalized abdominal pain: Secondary | ICD-10-CM

## 2012-05-27 DIAGNOSIS — O99210 Obesity complicating pregnancy, unspecified trimester: Secondary | ICD-10-CM | POA: Insufficient documentation

## 2012-05-27 DIAGNOSIS — R102 Pelvic and perineal pain: Secondary | ICD-10-CM

## 2012-05-27 DIAGNOSIS — R109 Unspecified abdominal pain: Secondary | ICD-10-CM | POA: Insufficient documentation

## 2012-05-27 DIAGNOSIS — N949 Unspecified condition associated with female genital organs and menstrual cycle: Secondary | ICD-10-CM | POA: Insufficient documentation

## 2012-05-27 DIAGNOSIS — O99891 Other specified diseases and conditions complicating pregnancy: Secondary | ICD-10-CM | POA: Insufficient documentation

## 2012-05-27 DIAGNOSIS — E669 Obesity, unspecified: Secondary | ICD-10-CM | POA: Insufficient documentation

## 2012-05-27 DIAGNOSIS — O99212 Obesity complicating pregnancy, second trimester: Secondary | ICD-10-CM

## 2012-05-27 HISTORY — DX: Trichomoniasis, unspecified: A59.9

## 2012-05-27 HISTORY — DX: Chlamydial infection, unspecified: A74.9

## 2012-05-27 LAB — URINALYSIS, ROUTINE W REFLEX MICROSCOPIC
Bilirubin Urine: NEGATIVE
Glucose, UA: NEGATIVE mg/dL
Ketones, ur: NEGATIVE mg/dL
Nitrite: NEGATIVE
Specific Gravity, Urine: 1.02 (ref 1.005–1.030)
pH: 6 (ref 5.0–8.0)

## 2012-05-27 LAB — URINE MICROSCOPIC-ADD ON

## 2012-05-27 MED ORDER — ACETAMINOPHEN 500 MG PO TABS
1000.0000 mg | ORAL_TABLET | Freq: Once | ORAL | Status: AC
  Administered 2012-05-27: 1000 mg via ORAL
  Filled 2012-05-27: qty 2

## 2012-05-27 NOTE — MAU Note (Signed)
Pt reports having round ligament pain in her right lower quadrant that started today. Pt stated she has not taken any pain medication.

## 2012-05-27 NOTE — MAU Provider Note (Signed)
History     CSN: [id number]  Arrival date and time: 05/27/12 1610   None     Chief Complaint  Patient presents with  . Pelvic Pain   HPI 22 y.o. G3P0020 at [redacted]w[redacted]d with right sided pain just above hip that she states is "round ligament pain". She says the pain started recently, is worse with lying flat on back or with position changes. No bleeding or discharge. Pt states she is very stressed d/t multiple situations in her life and is worried that her stress could affect her baby. She denies SI/HI.    Past Medical History  Diagnosis Date  . Medical history non-contributory   . Chlamydia   . Trichimoniasis     Past Surgical History  Procedure Laterality Date  . Wisdom tooth extraction      Family History  Problem Relation Age of Onset  . Hypertension Mother   . Bipolar disorder Mother   . Hypertension Maternal Aunt   . Bipolar disorder Maternal Aunt   . Schizophrenia Maternal Aunt   . OCD Maternal Aunt   . Hypertension Maternal Uncle   . Hypertension Maternal Grandmother     History  Substance Use Topics  . Smoking status: Former Games developer  . Smokeless tobacco: Never Used  . Alcohol Use: No     Comment: occasional mixed drinks     Allergies:  Allergies  Allergen Reactions  . Shellfish-Derived Products Anaphylaxis and Swelling  . Sulfa Antibiotics Other (See Comments)    Reaction unknown; told by mother she was allergic    Prescriptions prior to admission  Medication Sig Dispense Refill  . fluconazole (DIFLUCAN) 150 MG tablet Take 1 tablet (150 mg total) by mouth once. If no relief 3 days after first dose take second dose.  1 tablet  0  . Prenatal Vit-Fe Fumarate-FA (PRENATAL MULTIVITAMIN) TABS Take 1 tablet by mouth daily.        Review of Systems  Constitutional: Negative.  Negative for fever.  Respiratory: Negative.   Cardiovascular: Negative.   Gastrointestinal: Positive for abdominal pain. Negative for nausea, vomiting, diarrhea and constipation.   Genitourinary: Negative for dysuria, urgency, frequency, hematuria and flank pain.       Negative for vaginal bleeding, vaginal discharge, dyspareunia  Musculoskeletal: Negative.   Neurological: Negative.   Psychiatric/Behavioral: Negative.    Physical Exam   Blood pressure 122/63, pulse 91, temperature 98 F (36.7 C), temperature source Oral, resp. rate 18, height 5\' 6"  (1.676 m), weight 194 lb (87.998 kg), last menstrual period 01/27/2012.  Physical Exam  Nursing note and vitals reviewed. Constitutional: She appears well-developed and well-nourished. No distress.  Cardiovascular: Normal rate.   Respiratory: Effort normal.  GI: Soft. She exhibits no distension and no mass. There is no tenderness. There is no rebound and no guarding.  Musculoskeletal: Normal range of motion.  Neurological: She is alert.  Skin: Skin is warm and dry.  Psychiatric: She has a normal mood and affect.   + FHR MAU Course  Procedures Results for orders placed during the hospital encounter of 05/27/12 (from the past 24 hour(s))  URINALYSIS, ROUTINE W REFLEX MICROSCOPIC     Status: Abnormal   Collection Time    05/27/12  4:30 PM      Result Value Range   Color, Urine YELLOW  YELLOW   APPearance CLEAR  CLEAR   Specific Gravity, Urine 1.020  1.005 - 1.030   pH 6.0  5.0 - 8.0   Glucose, UA NEGATIVE  NEGATIVE mg/dL   Hgb urine dipstick NEGATIVE  NEGATIVE   Bilirubin Urine NEGATIVE  NEGATIVE   Ketones, ur NEGATIVE  NEGATIVE mg/dL   Protein, ur NEGATIVE  NEGATIVE mg/dL   Urobilinogen, UA 0.2  0.0 - 1.0 mg/dL   Nitrite NEGATIVE  NEGATIVE   Leukocytes, UA TRACE (*) NEGATIVE  URINE MICROSCOPIC-ADD ON     Status: Abnormal   Collection Time    05/27/12  4:30 PM      Result Value Range   Squamous Epithelial / LPF FEW (*) RARE   WBC, UA 3-6  <3 WBC/hpf   Bacteria, UA FEW (*) RARE   Urine-Other MUCOUS PRESENT       Assessment and Plan   1. Supervision of normal subsequent pregnancy, second trimester    2. Obesity complicating pregnancy, childbirth, or puerperium, antepartum, second trimester   3. Pain of round ligament complicating pregnancy, antepartum    Discussed comfort measures for round ligament pain as well as normal pregnancy discomforts vs. Warning signs.  Encouraged her to follow up with a counselor or in the clinic if she feels she can't cope with her current level of stress     Medication List    TAKE these medications       fluconazole 150 MG tablet  Commonly known as:  DIFLUCAN  Take 1 tablet (150 mg total) by mouth once. If no relief 3 days after first dose take second dose.     prenatal multivitamin Tabs  Take 1 tablet by mouth daily.            Follow-up Information   Follow up with [facility]. (as scheduled or sooner as needed)    Contact information:   [address] [place] [address] [phone]        [facility] 05/27/2012, 5:06 PM

## 2012-05-27 NOTE — MAU Note (Signed)
Patient presents to MAU with c/o intermittent lower abdominal pain, at R hip. Denies vaginal bleeding, LOF or cramping.

## 2012-05-27 NOTE — MAU Provider Note (Signed)
Attestation of Attending Supervision of Advanced Practitioner: Evaluation and management procedures were performed by the PA/NP/CNM/OB Fellow under my supervision/collaboration. Chart reviewed and agree with management and plan.  Sari Cogan V 05/27/2012 9:51 PM    

## 2012-05-28 LAB — URINE CULTURE: Colony Count: >=100000

## 2012-05-30 ENCOUNTER — Encounter: Payer: Self-pay | Admitting: *Deleted

## 2012-06-02 ENCOUNTER — Encounter (HOSPITAL_COMMUNITY): Payer: Self-pay | Admitting: *Deleted

## 2012-06-02 ENCOUNTER — Inpatient Hospital Stay (HOSPITAL_COMMUNITY)
Admission: AD | Admit: 2012-06-02 | Discharge: 2012-06-02 | Disposition: A | Discharge status: Home / Self Care | Payer: No Typology Code available for payment source | Source: Ambulatory Visit | Attending: Obstetrics & Gynecology | Admitting: Obstetrics & Gynecology

## 2012-06-02 DIAGNOSIS — Z3482 Encounter for supervision of other normal pregnancy, second trimester: Secondary | ICD-10-CM

## 2012-06-02 DIAGNOSIS — O99891 Other specified diseases and conditions complicating pregnancy: Secondary | ICD-10-CM | POA: Insufficient documentation

## 2012-06-02 DIAGNOSIS — Y92410 Unspecified street and highway as the place of occurrence of the external cause: Secondary | ICD-10-CM | POA: Insufficient documentation

## 2012-06-02 DIAGNOSIS — R51 Headache: Secondary | ICD-10-CM | POA: Insufficient documentation

## 2012-06-02 DIAGNOSIS — IMO0002 Reserved for concepts with insufficient information to code with codable children: Secondary | ICD-10-CM | POA: Insufficient documentation

## 2012-06-02 DIAGNOSIS — O99212 Obesity complicating pregnancy, second trimester: Secondary | ICD-10-CM

## 2012-06-02 HISTORY — DX: Urinary tract infection, site not specified: N39.0
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
## 2012-06-02 NOTE — Progress Notes (Signed)
Pt signed out AMA, transfer canceled
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
## 2012-06-03 NOTE — MAU Provider Note (Signed)
Attestation of Attending Supervision of Advanced Practitioner (PA/CNM/NP): Evaluation and management procedures were performed by the Advanced Practitioner under my supervision and collaboration.  I have reviewed the Advanced Practitioner's note and chart, and I agree with the management and plan.  Jannette Cotham, MD, FACOG Attending Obstetrician & Gynecologist Faculty Practice, [facility]  

## 2012-06-11 ENCOUNTER — Ambulatory Visit (HOSPITAL_COMMUNITY)
Admission: RE | Admit: 2012-06-11 | Discharge: 2012-06-11 | Disposition: A | Discharge status: Home / Self Care | Payer: Medicaid Other | Source: Ambulatory Visit | Attending: Advanced Practice Midwife | Admitting: Advanced Practice Midwife

## 2012-06-11 DIAGNOSIS — Z363 Encounter for antenatal screening for malformations: Secondary | ICD-10-CM | POA: Insufficient documentation

## 2012-06-11 DIAGNOSIS — E669 Obesity, unspecified: Secondary | ICD-10-CM | POA: Insufficient documentation

## 2012-06-11 DIAGNOSIS — Z1389 Encounter for screening for other disorder: Secondary | ICD-10-CM | POA: Insufficient documentation

## 2012-06-11 DIAGNOSIS — Z3492 Encounter for supervision of normal pregnancy, unspecified, second trimester: Secondary | ICD-10-CM

## 2012-06-11 DIAGNOSIS — O358XX0 Maternal care for other (suspected) fetal abnormality and damage, not applicable or unspecified: Secondary | ICD-10-CM | POA: Insufficient documentation

## 2012-06-12 ENCOUNTER — Encounter: Payer: Self-pay | Admitting: Advanced Practice Midwife

## 2012-06-14 ENCOUNTER — Encounter: Payer: Self-pay | Admitting: Advanced Practice Midwife

## 2012-06-20 ENCOUNTER — Encounter: Payer: Self-pay | Admitting: Advanced Practice Midwife

## 2012-06-20 ENCOUNTER — Ambulatory Visit (INDEPENDENT_AMBULATORY_CARE_PROVIDER_SITE_OTHER): Payer: Medicaid Other | Admitting: Advanced Practice Midwife

## 2012-06-20 ENCOUNTER — Encounter: Payer: Self-pay | Admitting: *Deleted

## 2012-06-20 VITALS — BP 130/65 | Temp 97.8°F | Wt 197.0 lb

## 2012-06-20 DIAGNOSIS — Z3492 Encounter for supervision of normal pregnancy, unspecified, second trimester: Secondary | ICD-10-CM

## 2012-06-20 DIAGNOSIS — D582 Other hemoglobinopathies: Secondary | ICD-10-CM

## 2012-06-20 DIAGNOSIS — D564 Hereditary persistence of fetal hemoglobin [HPFH]: Secondary | ICD-10-CM

## 2012-06-20 DIAGNOSIS — O99210 Obesity complicating pregnancy, unspecified trimester: Secondary | ICD-10-CM

## 2012-06-20 DIAGNOSIS — E669 Obesity, unspecified: Secondary | ICD-10-CM

## 2012-06-20 DIAGNOSIS — O99212 Obesity complicating pregnancy, second trimester: Secondary | ICD-10-CM

## 2012-06-20 LAB — POCT URINALYSIS DIP (DEVICE)
Bilirubin Urine: NEGATIVE
Nitrite: NEGATIVE
Specific Gravity, Urine: >=1.03 (ref 1.005–1.030)
pH: 6 (ref 5.0–8.0)

## 2012-06-20 NOTE — Progress Notes (Signed)
Ob follow up US scheduled on 4/21 @ 1530

## 2012-06-20 NOTE — Progress Notes (Signed)
Pulse: 77

## 2012-06-20 NOTE — Progress Notes (Signed)
C/O acne - rev'd normal pregnancy changes and OTC remedies. Considering transfer to [facility]. Incomplete anatomy scan - rescheduled today.

## 2012-06-25 ENCOUNTER — Ambulatory Visit (HOSPITAL_COMMUNITY)
Admission: RE | Admit: 2012-06-25 | Discharge: 2012-06-25 | Disposition: A | Discharge status: Home / Self Care | Payer: Medicaid Other | Source: Ambulatory Visit | Attending: Advanced Practice Midwife | Admitting: Advanced Practice Midwife

## 2012-06-25 ENCOUNTER — Encounter: Payer: Self-pay | Admitting: Advanced Practice Midwife

## 2012-06-25 DIAGNOSIS — Z3492 Encounter for supervision of normal pregnancy, unspecified, second trimester: Secondary | ICD-10-CM

## 2012-06-25 DIAGNOSIS — E669 Obesity, unspecified: Secondary | ICD-10-CM | POA: Insufficient documentation

## 2012-06-25 DIAGNOSIS — Z3689 Encounter for other specified antenatal screening: Secondary | ICD-10-CM | POA: Insufficient documentation

## 2012-07-05 ENCOUNTER — Encounter (HOSPITAL_COMMUNITY): Payer: Self-pay | Admitting: *Deleted

## 2012-07-05 ENCOUNTER — Inpatient Hospital Stay (HOSPITAL_COMMUNITY)
Admission: AD | Admit: 2012-07-05 | Discharge: 2012-07-05 | Disposition: A | Discharge status: Home / Self Care | Payer: Medicaid Other | Source: Ambulatory Visit | Attending: Obstetrics and Gynecology | Admitting: Obstetrics and Gynecology

## 2012-07-05 DIAGNOSIS — O4700 False labor before 37 completed weeks of gestation, unspecified trimester: Secondary | ICD-10-CM | POA: Insufficient documentation

## 2012-07-05 DIAGNOSIS — O99212 Obesity complicating pregnancy, second trimester: Secondary | ICD-10-CM

## 2012-07-05 DIAGNOSIS — N949 Unspecified condition associated with female genital organs and menstrual cycle: Secondary | ICD-10-CM | POA: Insufficient documentation

## 2012-07-05 DIAGNOSIS — O99210 Obesity complicating pregnancy, unspecified trimester: Secondary | ICD-10-CM | POA: Insufficient documentation

## 2012-07-05 DIAGNOSIS — O479 False labor, unspecified: Secondary | ICD-10-CM

## 2012-07-05 DIAGNOSIS — E669 Obesity, unspecified: Secondary | ICD-10-CM | POA: Insufficient documentation

## 2012-07-05 LAB — URINALYSIS, ROUTINE W REFLEX MICROSCOPIC
Bilirubin Urine: NEGATIVE
Glucose, UA: NEGATIVE mg/dL
Specific Gravity, Urine: 1.025 (ref 1.005–1.030)
Urobilinogen, UA: 0.2 mg/dL (ref 0.0–1.0)

## 2012-07-05 LAB — OB RESULTS CONSOLE GC/CHLAMYDIA
Chlamydia: NEGATIVE
Gonorrhea: NEGATIVE

## 2012-07-05 LAB — URINE MICROSCOPIC-ADD ON

## 2012-07-05 LAB — WET PREP, GENITAL
Clue Cells Wet Prep HPF POC: NONE SEEN
Trich, Wet Prep: NONE SEEN
Yeast Wet Prep HPF POC: NONE SEEN

## 2012-07-05 NOTE — MAU Note (Signed)
Patient states she has been having constant pelvic pain for about 1 1/2 weeks. Denies bleeding, discharge, nausea, vomiting or contractions.

## 2012-07-05 NOTE — MAU Provider Note (Signed)
History     CSN: [id number]  Arrival date & time 07/05/12  1124   None     Chief Complaint  Patient presents with  . Pelvic Pain    HPI  Christina Olsen is a 22 y.o. G3P0020 at [redacted]w[redacted]d who presents with pelvic pain and pressure. It has been going on for 1.5 weeks. She notices it most when she is walking. Denies having contractions, leaking fluid, or vaginal bleeding. No discharge or odor. She tried taking 1/2 of a tylenol last night but it did not help.  For prenatal care she comes to the clinic here at [facility]. No problems thus far in the pregnancy.  Past Medical History  Diagnosis Date  . Chlamydia   . Trichimoniasis   . Urinary tract infection   . Depression     was here last wk for depression and stress, doing better    Past Surgical History  Procedure Laterality Date  . Wisdom tooth extraction    . Fracture surgery  1999    right collarbone    Family History  Problem Relation Age of Onset  . Hypertension Mother   . Bipolar disorder Mother   . Hypertension Maternal Aunt   . Bipolar disorder Maternal Aunt   . Schizophrenia Maternal Aunt   . OCD Maternal Aunt   . Hypertension Maternal Uncle   . Hypertension Maternal Grandmother     History  Substance Use Topics  . Smoking status: Former Games developer  . Smokeless tobacco: Never Used     Comment: black and mild  . Alcohol Use: No     Comment: occasional mixed drinks     OB History   Grav Para Term Preterm Abortions TAB SAB Ect Mult Living   3    2  2    0      Review of Systems no contractions, fluid leaking, or vaginal bleeding.  Allergies  Shellfish-derived products and Sulfa antibiotics Cause swelling  Home Medications  No current outpatient prescriptions on file. Prenatal  Tried tylenol last night  BP 112/68  Pulse 86  Resp 16  Ht 5\' 5"  (1.651 m)  Wt 199 lb 12.8 oz (90.629 kg)  BMI 33.25 kg/m2  SpO2 100%  LMP 01/27/2012  Physical Exam Gen: NAD Lungs: NWOB Abd: gravid but  otherwise soft, nontender to palpation Neuro: grossly nonfocal, speech intact GU: normal appearing external genitalia. White discharge present in the vagina. Cervix is closed and thick     MAU Course  Procedures (including critical care time)  Labs Reviewed  WET PREP, GENITAL - Abnormal; Notable for the following:    WBC, Wet Prep HPF POC FEW (*)    All other components within normal limits  URINALYSIS, ROUTINE W REFLEX MICROSCOPIC - Abnormal; Notable for the following:    Hgb urine dipstick TRACE (*)    All other components within normal limits  URINE MICROSCOPIC-ADD ON - Abnormal; Notable for the following:    Squamous Epithelial / LPF FEW (*)    Bacteria, UA FEW (*)    All other components within normal limits  GC/CHLAMYDIA PROBE AMP   No results found.   1. Obesity complicating pregnancy, childbirth, or puerperium, antepartum, second trimester   2. Braxton Hick's contraction    MDM  Christina Olsen is a 22 y.o. G3P0020 at [redacted]w[redacted]d who presents with pelvic pressure. This is most likely normal pressure of pregnancy, related to braxton-hicks contractions. No abnormalities on exam. Wet prep and UA unremarkable. Will  send gc/chlamydia. Pt counseled that she can use tylenol for pain. Stable for discharge at this time.  Levert Feinstein, MD Family Medicine PGY-1  I saw and examined patient and agree with above resident note. I reviewed history, imaging, labs, and vitals. FHTs dopplered at 150.  Napoleon Form, MD

## 2012-07-06 LAB — GC/CHLAMYDIA PROBE AMP: GC Probe RNA: NEGATIVE

## 2012-07-09 NOTE — MAU Provider Note (Signed)
Attestation of Attending Supervision of Advanced Practitioner (CNM/NP): Evaluation and management procedures were performed by the Advanced Practitioner under my supervision and collaboration. I have reviewed the Advanced Practitioner's note and chart, and I agree with the management and plan.  Brynnly Bonet H. 3:15 PM   

## 2012-07-11 ENCOUNTER — Encounter: Payer: Self-pay | Admitting: Obstetrics and Gynecology

## 2012-07-18 ENCOUNTER — Telehealth: Payer: Self-pay | Admitting: *Deleted

## 2012-07-18 ENCOUNTER — Ambulatory Visit (INDEPENDENT_AMBULATORY_CARE_PROVIDER_SITE_OTHER): Payer: Medicaid Other | Admitting: Obstetrics and Gynecology

## 2012-07-18 VITALS — BP 111/76 | Temp 98.6°F | Wt 202.7 lb

## 2012-07-18 DIAGNOSIS — B373 Candidiasis of vulva and vagina: Secondary | ICD-10-CM

## 2012-07-18 DIAGNOSIS — B3731 Acute candidiasis of vulva and vagina: Secondary | ICD-10-CM | POA: Insufficient documentation

## 2012-07-18 LAB — POCT URINALYSIS DIP (DEVICE)
Hgb urine dipstick: NEGATIVE
Ketones, ur: NEGATIVE mg/dL
Protein, ur: NEGATIVE mg/dL
Specific Gravity, Urine: 1.025 (ref 1.005–1.030)
Urobilinogen, UA: 0.2 mg/dL (ref 0.0–1.0)
pH: 6 (ref 5.0–8.0)

## 2012-07-18 MED ORDER — FLUCONAZOLE 150 MG PO TABS: 150.0000 mg | ORAL_TABLET | Freq: Once | ORAL | Status: DC

## 2012-07-18 NOTE — Addendum Note (Signed)
Addended by: Franchot Mimes on: 07/18/2012 09:44 AM   Modules accepted: Orders

## 2012-07-18 NOTE — Patient Instructions (Signed)
Monilial Vaginitis Vaginitis in a soreness, swelling and redness (inflammation) of the vagina and vulva. Monilial vaginitis is not a sexually transmitted infection. CAUSES  Yeast vaginitis is caused by yeast (candida) that is normally found in your vagina. With a yeast infection, the candida has overgrown in number to a point that upsets the chemical balance. SYMPTOMS   White, thick vaginal discharge.  Swelling, itching, redness and irritation of the vagina and possibly the lips of the vagina (vulva).  Burning or painful urination.  Painful intercourse. DIAGNOSIS  Things that may contribute to monilial vaginitis are:  Postmenopausal and virginal states.  Pregnancy.  Infections.  Being tired, sick or stressed, especially if you had monilial vaginitis in the past.  Diabetes. Good control will help lower the chance.  Birth control pills.  Tight fitting garments.  Using bubble bath, feminine sprays, douches or deodorant tampons.  Taking certain medications that kill germs (antibiotics).  Sporadic recurrence can occur if you become ill. TREATMENT  Your caregiver will give you medication.  There are several kinds of anti monilial vaginal creams and suppositories specific for monilial vaginitis. For recurrent yeast infections, use a suppository or cream in the vagina 2 times a week, or as directed.  Anti-monilial or steroid cream for the itching or irritation of the vulva may also be used. Get your caregiver's permission.  Painting the vagina with methylene blue solution may help if the monilial cream does not work.  Eating yogurt may help prevent monilial vaginitis. HOME CARE INSTRUCTIONS   Finish all medication as prescribed.  Do not have sex until treatment is completed or after your caregiver tells you it is okay.  Take warm sitz baths.  Do not douche.  Do not use tampons, especially scented ones.  Wear cotton underwear.  Avoid tight pants and panty  hose.  Tell your sexual partner that you have a yeast infection. They should go to their caregiver if they have symptoms such as mild rash or itching.  Your sexual partner should be treated as well if your infection is difficult to eliminate.  Practice safer sex. Use condoms.  Some vaginal medications cause latex condoms to fail. Vaginal medications that harm condoms are:  Cleocin cream.  Butoconazole (Femstat).  Terconazole (Terazol) vaginal suppository.  Miconazole (Monistat) (may be purchased over the counter). SEEK MEDICAL CARE IF:   You have a temperature by mouth above 102 F (38.9 C).  The infection is getting worse after 2 days of treatment.  The infection is not getting better after 3 days of treatment.  You develop blisters in or around your vagina.  You develop vaginal bleeding, and it is not your menstrual period.  You have pain when you urinate.  You develop intestinal problems.  You have pain with sexual intercourse. Document Released: 12/01/2004 Document Revised: 05/16/2011 Document Reviewed: 08/15/2008 ExitCare Patient Information 2013 [employer].  

## 2012-07-18 NOTE — Telephone Encounter (Signed)
Patient stated at check out today that she is going to continue her care at [facility].  She filled out a release form, and we sent her records.  So no follow up appointment was made today.

## 2012-07-18 NOTE — Addendum Note (Signed)
Addended by: Jill Side on: 07/18/2012 01:34 PM   Modules accepted: Orders

## 2012-07-18 NOTE — Progress Notes (Signed)
Vaginal itch and white discharge. VE: thick white d/e and inflamed vaginal.  WP sent. Graduated [employer] in nutrition.

## 2012-07-18 NOTE — Progress Notes (Signed)
Pulse- 82 Patient reports pelvic pressure and c/o white d/c with itching/irritation- states she tried monistat but hasn't seen improvement

## 2012-07-19 LAB — WET PREP, GENITAL

## 2012-07-23 ENCOUNTER — Encounter: Payer: Self-pay | Admitting: Obstetrics & Gynecology

## 2012-09-05 ENCOUNTER — Encounter (HOSPITAL_COMMUNITY): Payer: Self-pay | Admitting: *Deleted

## 2012-09-05 ENCOUNTER — Inpatient Hospital Stay (HOSPITAL_COMMUNITY)
Admission: AD | Admit: 2012-09-05 | Discharge: 2012-09-05 | Disposition: A | Discharge status: Home / Self Care | Payer: Medicaid Other | Source: Ambulatory Visit | Attending: Obstetrics and Gynecology | Admitting: Obstetrics and Gynecology

## 2012-09-05 DIAGNOSIS — O98513 Other viral diseases complicating pregnancy, third trimester: Secondary | ICD-10-CM

## 2012-09-05 DIAGNOSIS — O4700 False labor before 37 completed weeks of gestation, unspecified trimester: Secondary | ICD-10-CM | POA: Insufficient documentation

## 2012-09-05 DIAGNOSIS — R109 Unspecified abdominal pain: Secondary | ICD-10-CM | POA: Insufficient documentation

## 2012-09-05 DIAGNOSIS — N949 Unspecified condition associated with female genital organs and menstrual cycle: Secondary | ICD-10-CM | POA: Insufficient documentation

## 2012-09-05 DIAGNOSIS — O99891 Other specified diseases and conditions complicating pregnancy: Secondary | ICD-10-CM | POA: Insufficient documentation

## 2012-09-05 DIAGNOSIS — B9789 Other viral agents as the cause of diseases classified elsewhere: Secondary | ICD-10-CM | POA: Insufficient documentation

## 2012-09-05 LAB — URINALYSIS, ROUTINE W REFLEX MICROSCOPIC
Glucose, UA: NEGATIVE mg/dL
Ketones, ur: NEGATIVE mg/dL
Nitrite: NEGATIVE
pH: 6.5 (ref 5.0–8.0)

## 2012-09-05 LAB — URINE MICROSCOPIC-ADD ON

## 2012-09-05 NOTE — MAU Note (Signed)
C/O sinus pressure and congestion, fever since yesterday.  C/O braxton hicks contractions, pressure in lower abdomen. Just does not feel well. Took benadryl last night. Still did not sleep. Pain on L side intermittant, "hard to explain".

## 2012-09-05 NOTE — MAU Note (Signed)
Patient states she feels cooler now. States it felt like a "hot flash."

## 2012-09-05 NOTE — MAU Note (Signed)
Patient states she feels "so hot." Skin temp feels normal to touch. Cool cloth applied to face. Some of covers removed. Room temp lowered.

## 2012-09-05 NOTE — MAU Provider Note (Signed)
History   21yo G3P0020 at [redacted]w[redacted]d presents unannounced with c/o abdominal tightening, vaginal pressure, sore throat and ears, and sinus pressure.  Denies VB, UCs, LOF, recent fever, resp or GI c/o's, UTI or PIH s/s. GFM.   Chief Complaint  Patient presents with  . Abdominal Pain    OB History   Grav Para Term Preterm Abortions TAB SAB Ect Mult Living   3    2  2    0      Past Medical History  Diagnosis Date  . Chlamydia   . Trichimoniasis   . Urinary tract infection   . Depression     was here last wk for depression and stress, doing better    Past Surgical History  Procedure Laterality Date  . Wisdom tooth extraction    . Fracture surgery  1999    right collarbone    Family History  Problem Relation Age of Onset  . Hypertension Mother   . Bipolar disorder Mother   . Hypertension Maternal Aunt   . Bipolar disorder Maternal Aunt   . Schizophrenia Maternal Aunt   . OCD Maternal Aunt   . Hypertension Maternal Uncle   . Hypertension Maternal Grandmother     History  Substance Use Topics  . Smoking status: Former Games developer  . Smokeless tobacco: Never Used     Comment: black and mild  . Alcohol Use: No     Comment: occasional mixed drinks     Allergies:  Allergies  Allergen Reactions  . Shellfish-Derived Products Anaphylaxis and Swelling  . Sulfa Antibiotics Other (See Comments)    Reaction unknown; told by mother she was allergic    Prescriptions prior to admission  Medication Sig Dispense Refill  . acetaminophen (TYLENOL) 325 MG tablet Take 650 mg by mouth every 6 (six) hours as needed for pain.      . diphenhydrAMINE (BENADRYL) 25 mg capsule Take 25 mg by mouth every 6 (six) hours as needed for itching.      . Prenatal Vit-Fe Fumarate-FA (PRENATAL MULTIVITAMIN) TABS Take 1 tablet by mouth daily.        ROS: see HPI above, all other systems are negative   Physical Exam   Blood pressure 113/55, pulse 95, temperature 99.1 F (37.3 C), temperature source  Oral, resp. rate 18, height 5' 3.25" (1.607 m), weight 211 lb (95.709 kg), last menstrual period 01/27/2012, SpO2 99.00%.  Chest: Clear Heart: RRR Abdomen: gravid, NT Extremities: WNL  Pelvic exam: normal external genitalia, vulva, vagina, cervix, uterus and adnexa. Dilation: Closed Effacement (%): 20 Cervical Position: Posterior Station: -3 Exam by:: J. Kunaal Walkins, CNM No VB noted   FHT: Reactive NST UCs: some irritability, no clear UC traced  ED Course  IUP at [redacted]w[redacted]d PTL evaluation Viral syndrome  Encouraged to hydrate Discussed BH PTL precautions  Discussed comfort measures for cold symptoms D/c home F/u at already scheduled appointment on 7/9 or call with worsening symptoms or fever    Haroldine Laws CNM, MSN 09/05/2012 10:28 AM

## 2012-09-05 NOTE — MAU Note (Signed)
Braxton hicks yesterday at work.  Continues to feel cramping in lower abd.  Ears started hurting, pressure in head, feel like "I'm overheated".  Just not feeling well.

## 2012-09-06 LAB — URINE CULTURE: Colony Count: 75000

## 2012-10-01 ENCOUNTER — Encounter (HOSPITAL_COMMUNITY): Payer: Self-pay | Admitting: *Deleted

## 2012-10-10 LAB — OB RESULTS CONSOLE GBS: GBS: NEGATIVE

## 2012-10-28 ENCOUNTER — Encounter (HOSPITAL_COMMUNITY): Payer: Self-pay

## 2012-10-28 ENCOUNTER — Inpatient Hospital Stay (HOSPITAL_COMMUNITY)
Admission: AD | Admit: 2012-10-28 | Discharge: 2012-10-28 | Disposition: A | Discharge status: Home / Self Care | Payer: Medicaid Other | Source: Ambulatory Visit | Attending: Obstetrics and Gynecology | Admitting: Obstetrics and Gynecology

## 2012-10-28 DIAGNOSIS — O99212 Obesity complicating pregnancy, second trimester: Secondary | ICD-10-CM

## 2012-10-28 DIAGNOSIS — O479 False labor, unspecified: Secondary | ICD-10-CM | POA: Insufficient documentation

## 2012-10-28 NOTE — MAU Note (Signed)
Contractions all day 

## 2012-11-01 ENCOUNTER — Inpatient Hospital Stay (HOSPITAL_COMMUNITY)
Admission: AD | Admit: 2012-11-01 | Discharge: 2012-11-01 | Disposition: A | Discharge status: Home / Self Care | Payer: Medicaid Other | Source: Ambulatory Visit | Attending: Obstetrics and Gynecology | Admitting: Obstetrics and Gynecology

## 2012-11-01 ENCOUNTER — Encounter (HOSPITAL_COMMUNITY): Payer: Self-pay | Admitting: *Deleted

## 2012-11-01 DIAGNOSIS — O411090 Infection of amniotic sac and membranes, unspecified, unspecified trimester, not applicable or unspecified: Secondary | ICD-10-CM | POA: Diagnosis present

## 2012-11-01 DIAGNOSIS — O99212 Obesity complicating pregnancy, second trimester: Secondary | ICD-10-CM

## 2012-11-01 DIAGNOSIS — O479 False labor, unspecified: Secondary | ICD-10-CM | POA: Insufficient documentation

## 2012-11-01 MED ORDER — FENTANYL CITRATE 0.05 MG/ML IJ SOLN
100.0000 ug | Freq: Once | INTRAMUSCULAR | Status: AC
  Administered 2012-11-01: 100 ug via INTRAMUSCULAR
  Filled 2012-11-01: qty 2

## 2012-11-01 MED ORDER — PROMETHAZINE HCL 25 MG/ML IJ SOLN
25.0000 mg | Freq: Once | INTRAMUSCULAR | Status: AC
  Administered 2012-11-01: 25 mg via INTRAVENOUS
  Filled 2012-11-01: qty 1

## 2012-11-01 MED ORDER — NALBUPHINE HCL 10 MG/ML IJ SOLN
10.0000 mg | Freq: Once | INTRAMUSCULAR | Status: AC
  Administered 2012-11-01: 10 mg via INTRAVENOUS
  Filled 2012-11-01: qty 1

## 2012-11-01 MED ORDER — LACTATED RINGERS IV BOLUS (SEPSIS)
1000.0000 mL | Freq: Once | INTRAVENOUS | Status: AC
  Administered 2012-11-01: 1000 mL via INTRAVENOUS

## 2012-11-01 NOTE — Consult Note (Signed)
DATE: 11/01/2012  Maternity Admissions Unit History and Physical Exam for an Obstetrics Patient  [name] is a 22 y.o. female, G3P0020, at [redacted]w[redacted]d gestation, who presents for evaluation of contractions. She has been followed at the [facility]cology division of [facility].  Her pregnancy has been complicated by a urinary tract infection. She says that she has been contracting for 5-6 days. She has been evaluated several times here in the emergency department and was found not to be in labor. She denies rupture membranes. She denies vaginal bleeding. See history below.  OB History   Grav Para Term Preterm Abortions TAB SAB Ect Mult Living   3    2  2    0      Past Medical History  Diagnosis Date  . Chlamydia   . Trichimoniasis   . Urinary tract infection     Prescriptions prior to admission  Medication Sig Dispense Refill  . acetaminophen (TYLENOL) 325 MG tablet Take 650 mg by mouth every 6 (six) hours as needed for pain.      . diphenhydrAMINE (BENADRYL) 25 mg capsule Take 25 mg by mouth every 6 (six) hours as needed for itching.      Marland Kitchen HYDROcodone-acetaminophen (VICODIN) 5-500 MG per tablet Take 1 tablet by mouth every 6 (six) hours as needed for pain.      . Prenatal Vit-Fe Fumarate-FA (PRENATAL MULTIVITAMIN) TABS Take 1 tablet by mouth daily.        Past Surgical History  Procedure Laterality Date  . Wisdom tooth extraction    . Fracture surgery  1999    right collarbone    Allergies  Allergen Reactions  . Shellfish-Derived Products Anaphylaxis and Swelling  . Sulfa Antibiotics Other (See Comments)    Reaction unknown; told by mother she was allergic    Family History: family history includes Bipolar disorder in her maternal aunt and mother; Hypertension in her maternal aunt, maternal grandmother, maternal uncle, and mother; OCD in her maternal aunt; Schizophrenia in her maternal aunt.  Social History:  reports that  she has quit smoking. She has never used smokeless tobacco. She reports that she does not drink alcohol or use illicit drugs.  Review of systems: Normal pregnancy complaints.  Admission Physical Exam:  Dilation: 2 Effacement (%): 50 Station: -3 Exam by:: Dr. Stefano Gaul Body mass index is 36.33 kg/(m^2).  Blood pressure 143/75, pulse 84, temperature 98 F (36.7 C), temperature source Oral, resp. rate 19, height 5\' 6"  (1.676 m), weight 225 lb (102.059 kg), last menstrual period 01/27/2012, SpO2 100.00%.  HEENT:                 Within normal limits Chest:                   Clear Heart:                    Regular rate and rhythm Abdomen:             Gravid and nontender Extremities:          Grossly normal Neurologic exam: Grossly normal  Prenatal labs: ABO, Rh:             A/POS/-- (02/19 1040) Antibody:              NEG (02/19 1040) Rubella:                  RPR:  NON REAC (02/19 1040)  HBsAg:                 NEGATIVE (02/19 1040)  HIV:                       NON REACTIVE (02/19 1040)  GBS:                     Negative (08/06 0000)  NST: Category 1; Contractions: Mild to moderate .   Assessment:  [redacted]w[redacted]d gestation  Uterine contractions: No labor  Plan:  The patient will be allowed to walk for several hours. If her cervix does not change, then she will be discharged to home.   Janine Limbo 11/01/2012, 6:08 PM

## 2012-11-01 NOTE — Progress Notes (Signed)
Dr. Stefano Gaul notified patient desire to go home versus ambulating. States ok to discharge patient home with reactive FHR.

## 2012-11-01 NOTE — MAU Note (Signed)
Pt reports she was seen in MD office today, membranes were stripped and contractions have been 2 minutes apart. Was given vicodin but it hasn't helped.

## 2012-11-01 NOTE — MAU Note (Signed)
Patient presents with c/o contractions every 2-3 minutes. Denies LOF or bleeding. States has lost mucus plug. STates was seen this am in MAU for contractions and was 2cm.

## 2012-11-01 NOTE — MAU Provider Note (Signed)
  History     CSN: [id number]  Arrival date and time: 11/01/12 0036   None     Chief Complaint  Patient presents with  . Labor Eval   HPI Comments: Pt is a G3P0 at [redacted]w[redacted]d arrives for labor check, pt very uncomfortable w ctx, states has been going on for 5 days, was seen in MAU last evening without cervical change and dc'd home w hydrocodone which pt states did not help at all. Pt also very anxious and requesting to be induced.        Past Medical History  Diagnosis Date  . Chlamydia   . Trichimoniasis   . Urinary tract infection     Past Surgical History  Procedure Laterality Date  . Wisdom tooth extraction    . Fracture surgery  1999    right collarbone    Family History  Problem Relation Age of Onset  . Hypertension Mother   . Bipolar disorder Mother   . Hypertension Maternal Aunt   . Bipolar disorder Maternal Aunt   . Schizophrenia Maternal Aunt   . OCD Maternal Aunt   . Hypertension Maternal Uncle   . Hypertension Maternal Grandmother     History  Substance Use Topics  . Smoking status: Former Games developer  . Smokeless tobacco: Never Used     Comment: black and mild  . Alcohol Use: No     Comment: occasional mixed drinks     Allergies:  Allergies  Allergen Reactions  . Shellfish-Derived Products Anaphylaxis and Swelling  . Sulfa Antibiotics Other (See Comments)    Reaction unknown; told by mother she was allergic    No prescriptions prior to admission    Review of Systems  All other systems reviewed and are negative.   Physical Exam   Blood pressure 124/81, pulse 69, temperature 98.3 F (36.8 C), temperature source Oral, resp. rate 16, height 5\' 6"  (1.676 m), weight 225 lb (102.059 kg), last menstrual period 01/27/2012, SpO2 100.00%.  Physical Exam  Nursing note and vitals reviewed. Constitutional: She is oriented to person, place, and time. She appears well-developed and well-nourished. She appears distressed.  Appears anxious, moaning,  grimacing   HENT:  Head: Normocephalic.  Eyes: Pupils are equal, round, and reactive to light.  Neck: Normal range of motion.  Cardiovascular: Normal rate, regular rhythm and normal heart sounds.   Respiratory: Effort normal and breath sounds normal.  GI: Soft. Bowel sounds are normal.  Genitourinary: Vagina normal.  Musculoskeletal: Normal range of motion.  Neurological: She is alert and oriented to person, place, and time. She has normal reflexes.  Skin: Skin is warm and dry.  Psychiatric: She has a normal mood and affect. Her behavior is normal.    MAU Course  Procedures    Assessment and Plan  IUP at [redacted]w[redacted]d Prodromal labor No cervical change over several hours Initially rcv'd fentanyl IM Then rcv'd liter of LR and nubain/phenergan IVP w good benefit FHR cat 1 GBS neg dc'd home w comfort measures, FKC and labor s/s Office to call to schedule IOL after 9/5 if undelivered   Jaana Brodt M 11/01/2012, 7:59 AM

## 2012-11-01 NOTE — Progress Notes (Signed)
S: pain scale 8/10, reduced from 10/10, still appears very uncomfortable and not coping well w ctx, requesting additional pain meds, pt requesting IOL   O: VSS FHR cat 1 - (prior to dc EFM 1hr ago) toco 3-4  VE =2/50/-3   A: IUP at [redacted]w[redacted]d Prodromal labor No cervical change   P: IVF's LR bolus 1000cc nubain 10mg  IVP Phenergan 25mg  IVP Reevaluate 1 hr

## 2012-11-02 ENCOUNTER — Encounter (HOSPITAL_COMMUNITY): Payer: Self-pay | Admitting: *Deleted

## 2012-11-02 ENCOUNTER — Encounter (HOSPITAL_COMMUNITY)
Admission: AD | Disposition: A | Discharge status: Home / Self Care | Payer: Self-pay | Source: Ambulatory Visit | Attending: Obstetrics and Gynecology

## 2012-11-02 ENCOUNTER — Inpatient Hospital Stay (HOSPITAL_COMMUNITY): Payer: Medicaid Other | Admitting: Anesthesiology

## 2012-11-02 ENCOUNTER — Inpatient Hospital Stay (HOSPITAL_COMMUNITY)
Admission: AD | Admit: 2012-11-02 | Discharge: 2012-11-05 | DRG: 765 | Disposition: A | Discharge status: Home / Self Care | Payer: Medicaid Other | Source: Ambulatory Visit | Attending: Obstetrics and Gynecology | Admitting: Obstetrics and Gynecology

## 2012-11-02 ENCOUNTER — Encounter (HOSPITAL_COMMUNITY): Payer: Self-pay | Admitting: Anesthesiology

## 2012-11-02 DIAGNOSIS — O26849 Uterine size-date discrepancy, unspecified trimester: Secondary | ICD-10-CM | POA: Insufficient documentation

## 2012-11-02 DIAGNOSIS — M549 Dorsalgia, unspecified: Secondary | ICD-10-CM | POA: Insufficient documentation

## 2012-11-02 DIAGNOSIS — Z3483 Encounter for supervision of other normal pregnancy, third trimester: Secondary | ICD-10-CM

## 2012-11-02 DIAGNOSIS — Z98891 History of uterine scar from previous surgery: Secondary | ICD-10-CM

## 2012-11-02 DIAGNOSIS — O99212 Obesity complicating pregnancy, second trimester: Secondary | ICD-10-CM

## 2012-11-02 LAB — CBC
MCH: 26.4 pg (ref 26.0–34.0)
MCHC: 33.6 g/dL (ref 30.0–36.0)
Platelets: 135 10*3/uL — ABNORMAL LOW (ref 150–400)
RDW: 15.2 % (ref 11.5–15.5)

## 2012-11-02 LAB — RPR: RPR Ser Ql: NONREACTIVE

## 2012-11-02 SURGERY — Surgical Case
Anesthesia: Epidural | Wound class: Clean

## 2012-11-02 MED ORDER — PHENYLEPHRINE 40 MCG/ML (10ML) SYRINGE FOR IV PUSH (FOR BLOOD PRESSURE SUPPORT): 80.0000 ug | PREFILLED_SYRINGE | INTRAVENOUS | Status: DC | PRN

## 2012-11-02 MED ORDER — LIDOCAINE-EPINEPHRINE (PF) 2 %-1:200000 IJ SOLN
INTRAMUSCULAR | Status: AC
  Filled 2012-11-02: qty 20

## 2012-11-02 MED ORDER — OXYCODONE-ACETAMINOPHEN 5-325 MG PO TABS: 1.0000 | ORAL_TABLET | ORAL | Status: DC | PRN

## 2012-11-02 MED ORDER — OXYTOCIN 10 UNIT/ML IJ SOLN
INTRAMUSCULAR | Status: AC
  Filled 2012-11-02: qty 4

## 2012-11-02 MED ORDER — PHENYLEPHRINE 40 MCG/ML (10ML) SYRINGE FOR IV PUSH (FOR BLOOD PRESSURE SUPPORT)
80.0000 ug | PREFILLED_SYRINGE | INTRAVENOUS | Status: DC | PRN
  Filled 2012-11-02: qty 5

## 2012-11-02 MED ORDER — NALBUPHINE HCL 10 MG/ML IJ SOLN
10.0000 mg | Freq: Once | INTRAMUSCULAR | Status: AC
  Administered 2012-11-02: 10 mg via INTRAVENOUS
  Filled 2012-11-02: qty 1

## 2012-11-02 MED ORDER — FENTANYL 2.5 MCG/ML BUPIVACAINE 1/10 % EPIDURAL INFUSION (WH - ANES)
14.0000 mL/h | INTRAMUSCULAR | Status: DC | PRN
  Administered 2012-11-02: 14 mL/h via EPIDURAL
  Filled 2012-11-02 (×2): qty 125

## 2012-11-02 MED ORDER — EPHEDRINE 5 MG/ML INJ: 10.0000 mg | INTRAVENOUS | Status: DC | PRN

## 2012-11-02 MED ORDER — LACTATED RINGERS IV SOLN
INTRAVENOUS | Status: DC
  Administered 2012-11-02 (×2): via INTRAVENOUS

## 2012-11-02 MED ORDER — LACTATED RINGERS IV SOLN: 500.0000 mL | INTRAVENOUS | Status: DC | PRN

## 2012-11-02 MED ORDER — ONDANSETRON HCL 4 MG/2ML IJ SOLN: 4.0000 mg | Freq: Four times a day (QID) | INTRAMUSCULAR | Status: DC | PRN

## 2012-11-02 MED ORDER — OXYTOCIN 40 UNITS IN LACTATED RINGERS INFUSION - SIMPLE MED
1.0000 m[IU]/min | INTRAVENOUS | Status: DC
  Administered 2012-11-02: 2 m[IU]/min via INTRAVENOUS

## 2012-11-02 MED ORDER — MORPHINE SULFATE (PF) 0.5 MG/ML IJ SOLN
INTRAMUSCULAR | Status: DC | PRN
  Administered 2012-11-02: 1 mg via INTRAVENOUS
  Administered 2012-11-02: 4 mg via EPIDURAL

## 2012-11-02 MED ORDER — LIDOCAINE HCL (PF) 1 % IJ SOLN
INTRAMUSCULAR | Status: DC | PRN
  Administered 2012-11-02 (×2): 9 mL

## 2012-11-02 MED ORDER — CITRIC ACID-SODIUM CITRATE 334-500 MG/5ML PO SOLN
30.0000 mL | ORAL | Status: DC | PRN
  Administered 2012-11-02: 30 mL via ORAL
  Filled 2012-11-02: qty 15

## 2012-11-02 MED ORDER — MORPHINE SULFATE 0.5 MG/ML IJ SOLN
INTRAMUSCULAR | Status: AC
  Filled 2012-11-02: qty 10

## 2012-11-02 MED ORDER — MEPERIDINE HCL 25 MG/ML IJ SOLN
INTRAMUSCULAR | Status: AC
  Filled 2012-11-02: qty 1

## 2012-11-02 MED ORDER — 0.9 % SODIUM CHLORIDE (POUR BTL) OPTIME
TOPICAL | Status: DC | PRN
  Administered 2012-11-02: 1000 mL

## 2012-11-02 MED ORDER — SODIUM BICARBONATE 8.4 % IV SOLN
INTRAVENOUS | Status: DC | PRN
  Administered 2012-11-02: 5 mL via EPIDURAL
  Administered 2012-11-02: 10 mL via EPIDURAL

## 2012-11-02 MED ORDER — LACTATED RINGERS IV SOLN
INTRAVENOUS | Status: DC
  Administered 2012-11-02: 06:00:00 via INTRAVENOUS

## 2012-11-02 MED ORDER — FENTANYL 2.5 MCG/ML BUPIVACAINE 1/10 % EPIDURAL INFUSION (WH - ANES)
INTRAMUSCULAR | Status: DC | PRN
Start: 2012-11-02 — End: 2012-11-03
  Administered 2012-11-02: 14 mL/h via EPIDURAL

## 2012-11-02 MED ORDER — EPHEDRINE 5 MG/ML INJ
10.0000 mg | INTRAVENOUS | Status: DC | PRN
  Filled 2012-11-02: qty 4

## 2012-11-02 MED ORDER — ONDANSETRON HCL 4 MG/2ML IJ SOLN
INTRAMUSCULAR | Status: DC | PRN
  Administered 2012-11-02: 4 mg via INTRAVENOUS

## 2012-11-02 MED ORDER — FLEET ENEMA 7-19 GM/118ML RE ENEM: 1.0000 | ENEMA | RECTAL | Status: DC | PRN

## 2012-11-02 MED ORDER — DIPHENHYDRAMINE HCL 50 MG/ML IJ SOLN
12.5000 mg | INTRAMUSCULAR | Status: DC | PRN
  Administered 2012-11-03: 12.5 mg via INTRAVENOUS

## 2012-11-02 MED ORDER — CEFAZOLIN SODIUM-DEXTROSE 2-3 GM-% IV SOLR
2.0000 g | Freq: Once | INTRAVENOUS | Status: AC
  Administered 2012-11-02: 2 g via INTRAVENOUS
  Filled 2012-11-02: qty 50

## 2012-11-02 MED ORDER — LIDOCAINE HCL (PF) 1 % IJ SOLN: 30.0000 mL | INTRAMUSCULAR | Status: DC | PRN

## 2012-11-02 MED ORDER — FENTANYL CITRATE 0.05 MG/ML IJ SOLN: 100.0000 ug | Freq: Once | INTRAMUSCULAR | Status: DC

## 2012-11-02 MED ORDER — LACTATED RINGERS IV SOLN
500.0000 mL | Freq: Once | INTRAVENOUS | Status: AC
  Administered 2012-11-02: 14:00:00 via INTRAVENOUS

## 2012-11-02 MED ORDER — MEPERIDINE HCL 25 MG/ML IJ SOLN
INTRAMUSCULAR | Status: DC | PRN
Start: 2012-11-02 — End: 2012-11-03
  Administered 2012-11-02 (×2): 12.5 mg via INTRAVENOUS

## 2012-11-02 MED ORDER — OXYTOCIN 10 UNIT/ML IJ SOLN
40.0000 [IU] | INTRAVENOUS | Status: DC | PRN
  Administered 2012-11-02: 40 [IU] via INTRAVENOUS

## 2012-11-02 MED ORDER — ONDANSETRON HCL 4 MG/2ML IJ SOLN
INTRAMUSCULAR | Status: AC
  Filled 2012-11-02: qty 2

## 2012-11-02 MED ORDER — LACTATED RINGERS IV SOLN
Freq: Once | INTRAVENOUS | Status: AC
  Administered 2012-11-02: 250 mL via INTRAUTERINE

## 2012-11-02 MED ORDER — ACETAMINOPHEN 325 MG PO TABS: 650.0000 mg | ORAL_TABLET | ORAL | Status: DC | PRN

## 2012-11-02 MED ORDER — OXYTOCIN BOLUS FROM INFUSION: 500.0000 mL | INTRAVENOUS | Status: DC

## 2012-11-02 MED ORDER — TERBUTALINE SULFATE 1 MG/ML IJ SOLN: 0.2500 mg | Freq: Once | INTRAMUSCULAR | Status: AC | PRN

## 2012-11-02 MED ORDER — OXYTOCIN 40 UNITS IN LACTATED RINGERS INFUSION - SIMPLE MED
62.5000 mL/h | INTRAVENOUS | Status: DC
  Filled 2012-11-02: qty 1000

## 2012-11-02 MED ORDER — NALBUPHINE SYRINGE 5 MG/0.5 ML
5.0000 mg | INJECTION | INTRAMUSCULAR | Status: DC | PRN
  Administered 2012-11-02: 5 mg via INTRAVENOUS
  Filled 2012-11-02 (×3): qty 0.5

## 2012-11-02 MED ORDER — IBUPROFEN 600 MG PO TABS: 600.0000 mg | ORAL_TABLET | Freq: Four times a day (QID) | ORAL | Status: DC | PRN

## 2012-11-02 MED ORDER — SODIUM BICARBONATE 8.4 % IV SOLN
INTRAVENOUS | Status: AC
  Filled 2012-11-02: qty 50

## 2012-11-02 MED ORDER — BUTORPHANOL TARTRATE 1 MG/ML IJ SOLN
1.0000 mg | INTRAMUSCULAR | Status: DC | PRN
  Administered 2012-11-02: 1 mg via INTRAVENOUS
  Filled 2012-11-02: qty 1

## 2012-11-02 MED ORDER — LACTATED RINGERS IV SOLN
INTRAVENOUS | Status: DC | PRN
  Administered 2012-11-02: 23:00:00 via INTRAVENOUS

## 2012-11-02 SURGICAL SUPPLY — 39 items
BENZOIN TINCTURE PRP APPL 2/3 (GAUZE/BANDAGES/DRESSINGS) ×2 IMPLANT
CLAMP CORD UMBIL (MISCELLANEOUS) IMPLANT
CLOTH BEACON ORANGE TIMEOUT ST (SAFETY) ×2 IMPLANT
CONTAINER PREFILL 10% NBF 15ML (MISCELLANEOUS) IMPLANT
DRAIN JACKSON PRT FLT 10 (DRAIN) IMPLANT
DRAPE LG THREE QUARTER DISP (DRAPES) ×2 IMPLANT
DRSG OPSITE POSTOP 4X10 (GAUZE/BANDAGES/DRESSINGS) ×2 IMPLANT
DURAPREP 26ML APPLICATOR (WOUND CARE) ×2 IMPLANT
ELECT REM PT RETURN 9FT ADLT (ELECTROSURGICAL) ×2
ELECTRODE REM PT RTRN 9FT ADLT (ELECTROSURGICAL) ×1 IMPLANT
EVACUATOR SILICONE 100CC (DRAIN) IMPLANT
EXTRACTOR VACUUM M CUP 4 TUBE (SUCTIONS) IMPLANT
GLOVE BIO SURGEON STRL SZ 6.5 (GLOVE) ×2 IMPLANT
GLOVE BIO SURGEON STRL SZ7.5 (GLOVE) ×4 IMPLANT
GLOVE BIOGEL PI IND STRL 7.0 (GLOVE) ×2 IMPLANT
GLOVE BIOGEL PI IND STRL 7.5 (GLOVE) ×1 IMPLANT
GLOVE BIOGEL PI INDICATOR 7.0 (GLOVE) ×2
GLOVE BIOGEL PI INDICATOR 7.5 (GLOVE) ×1
GOWN STRL REIN XL XLG (GOWN DISPOSABLE) ×4 IMPLANT
KIT ABG SYR 3ML LUER SLIP (SYRINGE) IMPLANT
NEEDLE HYPO 25X5/8 SAFETYGLIDE (NEEDLE) IMPLANT
NS IRRIG 1000ML POUR BTL (IV SOLUTION) ×4 IMPLANT
PACK C SECTION WH (CUSTOM PROCEDURE TRAY) ×2 IMPLANT
PAD OB MATERNITY 4.3X12.25 (PERSONAL CARE ITEMS) ×2 IMPLANT
RTRCTR C-SECT PINK 25CM LRG (MISCELLANEOUS) ×2 IMPLANT
STAPLER VISISTAT 35W (STAPLE) IMPLANT
STRIP CLOSURE SKIN 1/2X4 (GAUZE/BANDAGES/DRESSINGS) ×2 IMPLANT
STRIP CLOSURE SKIN 1/4X4 (GAUZE/BANDAGES/DRESSINGS) ×2 IMPLANT
SUT CHROMIC 0 CT 1 (SUTURE) ×2 IMPLANT
SUT MNCRL AB 3-0 PS2 27 (SUTURE) ×2 IMPLANT
SUT PLAIN 2 0 (SUTURE) ×2
SUT PLAIN 2 0 XLH (SUTURE) ×2 IMPLANT
SUT PLAIN ABS 2-0 CT1 27XMFL (SUTURE) ×2 IMPLANT
SUT SILK 2 0 SH (SUTURE) ×2 IMPLANT
SUT VIC AB 0 CTX 36 (SUTURE) ×4
SUT VIC AB 0 CTX36XBRD ANBCTRL (SUTURE) ×4 IMPLANT
TOWEL OR 17X24 6PK STRL BLUE (TOWEL DISPOSABLE) ×2 IMPLANT
TRAY FOLEY CATH 14FR (SET/KITS/TRAYS/PACK) IMPLANT
WATER STERILE IRR 1000ML POUR (IV SOLUTION) ×2 IMPLANT

## 2012-11-02 NOTE — H&P (Signed)
Christina Olsen is a 22 y.o. female presenting for labor.  MAU RN reported pt progressed from 3cm to 5cm.  Pt denied LOF or VB and reported good FM.  History OB History   Grav Para Term Preterm Abortions TAB SAB Ect Mult Living   3    2  2    0     Past Medical History  Diagnosis Date  . Chlamydia   . Trichimoniasis   . Urinary tract infection    Past Surgical History  Procedure Laterality Date  . Wisdom tooth extraction    . Fracture surgery  1999    right collarbone   Family History: family history includes Bipolar disorder in her maternal aunt and mother; Hypertension in her maternal aunt, maternal grandmother, maternal uncle, and mother; OCD in her maternal aunt; Schizophrenia in her maternal aunt. Social History:  reports that she has quit smoking. She has never used smokeless tobacco. She reports that she does not drink alcohol or use illicit drugs.   Prenatal Transfer Tool  Maternal Diabetes: No Genetic Screening: Normal Maternal Ultrasounds/Referrals: Normal Fetal Ultrasounds or other Referrals:  None Maternal Substance Abuse:  No Significant Maternal Medications:  None Significant Maternal Lab Results:  None (GBS neg) Other Comments:  None  ROS Non-contributory  Dilation: 4.5 Effacement (%): 100 Station: -2;-3 Exam by:: jolynn Blood pressure 121/66, pulse 71, temperature 98.1 F (36.7 C), temperature source Oral, resp. rate 18, height 5\' 4"  (1.626 m), weight 101.776 kg (224 lb 6 oz), last menstrual period 01/27/2012. Exam Physical Exam  Prenatal labs: ABO, Rh: A/POS/-- (02/19 1040) Antibody: NEG (02/19 1040) Rubella: 3.93 (02/19 1040) RPR: NON REAC (02/19 1040)  HBsAg: NEGATIVE (02/19 1040)  HIV: NON REACTIVE (02/19 1040)  GBS: Negative (08/06 0000)   Assessment/Plan: P0 at 40wks being admitted in labor.  Fetal status is overall reassuring.  GBS neg.  Pain meds upon request.   Purcell Nails 11/02/2012, 8:51 AM

## 2012-11-02 NOTE — Progress Notes (Signed)
  Subjective: In pain with contractions.  Objective: BP 135/77  Pulse 74  Temp(Src) 98.2 F (36.8 C) (Oral)  Resp 18  Ht 5\' 4"  (1.626 m)  Wt 101.776 kg (224 lb 6 oz)  BMI 38.49 kg/m2  SpO2 96%  LMP 01/27/2012      FHT:  Category 1 UC:   regular, every 2-3 minutes SVE:   Dilation: 5 Effacement (%): 90 Station: -1 Exam by:: Manfred Arch, CNM Cervix becoming edematous. Pitocin on 4 mu/min No change in cervix x 4 hours of MVUs 200+.  Assessment / Plan: Arrest of active phase Consulted with Dr. Normand Sloop. Reviewed status with patient and mother--C/S recommended. R&B of C/S reviewed, including bleeding, infection, and damage to other organs.  Patient seems to understand these risks and wishes to proceed with C/S.  Nigel Bridgeman 11/02/2012, 10:10 PM

## 2012-11-02 NOTE — Progress Notes (Signed)
Christina Olsen is a 22 y.o. G3P0020 at [redacted]w[redacted]d admitted for active labor.  Subjective: No complaints.  S/p epidural about 1hour ago.  Pt is now comfortable.  Objective: BP 124/59  Pulse 73  Temp(Src) 97.6 F (36.4 C) (Oral)  Resp 24  Ht 5\' 4"  (1.626 m)  Wt 101.776 kg (224 lb 6 oz)  BMI 38.49 kg/m2  SpO2 99%  LMP 01/27/2012      FHT:  FHR: 120s bpm, variability: moderate,  accelerations:  Present,  decelerations:  Absent UC:   regular, every 3 minutes SVE:   Dilation: 4 Effacement (%): 90 Station: -3 Exam by:: Paula Zietz  Labs: Lab Results  Component Value Date   WBC 15.5* 11/02/2012   HGB 11.7* 11/02/2012   HCT 34.8* 11/02/2012   MCV 78.4 11/02/2012   PLT 135* 11/02/2012    Assessment / Plan: Arrested at 4cm after being admitted in labor.  Will augment now.  Labor: will start pitocin for augmentation now per protocol Preeclampsia:  no s/sxs of preeclampsia Fetal Wellbeing:  Category I Pain Control:  Epidural I/D:  n/a Anticipated MOD:  NSVD  Eitan Doubleday Y 11/02/2012, 3:15 PM

## 2012-11-02 NOTE — H&P (Signed)
QUENTIN STREBEL is a 22 y.o. female, G3P0020 at [redacted]w[redacted]d, presenting for labor check.  UCs have been present for the last week however tonight they were timed at Q 4 min.  Denies VB, LOF, recent fever, resp or GI c/o's, UTI or PIH s/s. GFM. Desires epidural.  Patient Active Problem List   Diagnosis Date Noted  . Uterine size date discrepancy 11/02/2012  . Backache 11/02/2012  . Vaginal yeast infection 07/18/2012  . 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 Supervision of normal subsequent pregnancy 05/24/2012  . Obesity complicating pregnancy, childbirth, or puerperium, antepartum 04/25/2012    History of present pregnancy: Patient entered care at 26 weeks.  Pt transferred from [facility] clinic, [redacted]w[redacted]d. EDC of 11/02/12 was established by LMP.   Anatomy scan:  19 weeks, limited with normal findings and an anterior placenta.  21 weeks anatomy f/u completed anatomy survey Additional [place] evaluations:  [redacted]w[redacted]d for Growth S<D - EFW 70th%ile, normal fluid.   Significant prenatal events:  none   Last evaluation:  10/31/12 at [redacted]w[redacted]d  1 cm / 50% / -2  OB History   Grav Para Term Preterm Abortions TAB SAB Ect Mult Living   3    2  2    0     Past Medical History  Diagnosis Date  . Chlamydia   . Trichimoniasis   . Urinary tract infection    Past Surgical History  Procedure Laterality Date  . Wisdom tooth extraction    . Fracture surgery  1999    right collarbone   Family History: family history includes Bipolar disorder in her maternal aunt and mother; Hypertension in her maternal aunt, maternal grandmother, maternal uncle, and mother; OCD in her maternal aunt; Schizophrenia in her maternal aunt. Social History:  reports that she has quit smoking. She has never used smokeless tobacco. She reports that she does not drink alcohol or use illicit drugs.   Prenatal Transfer Tool  Maternal Diabetes: No Genetic Screening: Normal Maternal Ultrasounds/Referrals: Normal Fetal Ultrasounds or  other Referrals:  None Maternal Substance Abuse:  No Significant Maternal Medications:  None Significant Maternal Lab Results: Lab values include: Group B Strep negative    ROS: see HPI above, all other systems are negative  Allergies  Allergen Reactions  . Shellfish-Derived Products Anaphylaxis and Swelling  . Sulfa Antibiotics Other (See Comments)    Reaction unknown; told by mother she was allergic     Dilation: 3 Effacement (%): 100 Station: -2 Exam by:: B Mosca RN Blood pressure 131/77, pulse 80, temperature 98.1 F (36.7 C), temperature source Oral, resp. rate 20, height 5\' 4"  (1.626 m), weight 224 lb 6 oz (101.776 kg), last menstrual period 01/27/2012.     Prenatal labs: ABO, Rh: A/POS/-- (02/19 1040) Antibody: NEG (02/19 1040) Rubella:   Immune RPR: NON REAC (02/19 1040)  HBsAg: NEGATIVE (02/19 1040)  HIV: NON REACTIVE (02/19 1040)  GBS: Negative (08/06 0000) Sickle cell/Hgb electrophoresis:  Shows persistent fetal hemoglobin - not sickle cell trait Pap:  unknown GC:  Neg Chlamydia:  Neg Genetic screenings:  AFP neg Glucola:  83 Other:  n/a   Assessment/Plan: IUP at [redacted]w[redacted]d Active labor GBS neg  Admit to BS per Dr. Stefano Gaul as attending MD Routine [facility] admission orders Epidural prn  Rowan Blase, MSN 11/02/2012, 6:27 AM

## 2012-11-02 NOTE — Progress Notes (Signed)
  Subjective: Feeling pressure in vagina.  Appears tense.  Family at bedside.  Objective: BP 135/77  Pulse 74  Temp(Src) 98.2 F (36.8 C) (Oral)  Resp 18  Ht 5\' 4"  (1.626 m)  Wt 101.776 kg (224 lb 6 oz)  BMI 38.49 kg/m2  SpO2 96%  LMP 01/27/2012      FHT: Category 2--baseline 115-125, occasional early decels. UC:   regular, every 2-3 minutes SVE:   Dilation: 5 Effacement (%): 90 Station: -2 Exam by:: Dr. Su Hilt MVUs 245 since insertion of IUPC at 6pm  Assessment / Plan: Slow progress GBS negative Will continue augmentation, observe progress. Epidural PCA doses prn.  Nigel Bridgeman 11/02/2012, 8:12 PM

## 2012-11-02 NOTE — MAU Note (Signed)
Pt reports uc's for the last week. Tonight they are less than 4 minutes apart. Pt denies VB or LOF.

## 2012-11-02 NOTE — MAU Note (Signed)
PT SAYS SHE WAS HERE ON Thursday-   2 CM.      WORSE AT 0100.   DENIES HSV AND MRSA.

## 2012-11-02 NOTE — Progress Notes (Signed)
Patient ID: Christina Olsen, female   DOB: 1990-07-31, 22 y.o.   MRN: [id number] Patient without cervical change.  Contractions are adequate with IUPC on pitocin.   Pt consented for cesarean section.  R&B reviewed in detail.  Will proceed with surgeryl

## 2012-11-02 NOTE — Anesthesia Preprocedure Evaluation (Signed)
Anesthesia Evaluation  Patient identified by MRN, date of birth, ID band Patient awake    Reviewed: Allergy & Precautions, H&P , NPO status , Patient's Chart, lab work & pertinent test results  Airway Mallampati: II TM Distance: >3 FB Neck ROM: full    Dental no notable dental hx.    Pulmonary neg pulmonary ROS,    Pulmonary exam normal       Cardiovascular negative cardio ROS      Neuro/Psych negative neurological ROS  negative psych ROS   GI/Hepatic negative GI ROS, Neg liver ROS,   Endo/Other  negative endocrine ROS  Renal/GU negative Renal ROS  negative genitourinary   Musculoskeletal   Abdominal Normal abdominal exam  (+)   Peds  Hematology negative hematology ROS (+)   Anesthesia Other Findings   Reproductive/Obstetrics (+) Pregnancy                           Anesthesia Physical Anesthesia Plan  ASA: II  Anesthesia Plan: Epidural   Post-op Pain Management:    Induction:   Airway Management Planned:   Additional Equipment:   Intra-op Plan:   Post-operative Plan:   Informed Consent: I have reviewed the patients History and Physical, chart, labs and discussed the procedure including the risks, benefits and alternatives for the proposed anesthesia with the patient or authorized representative who has indicated his/her understanding and acceptance.     Plan Discussed with:   Anesthesia Plan Comments:         Anesthesia Quick Evaluation

## 2012-11-02 NOTE — Anesthesia Procedure Notes (Addendum)
Epidural Patient location during procedure: OB Start time: 11/02/2012 1:47 PM End time: 11/02/2012 1:51 PM  Staffing Anesthesiologist: Sandrea Hughs Performed by: anesthesiologist   Preanesthetic Checklist Completed: patient identified, surgical consent, pre-op evaluation, timeout performed, IV checked, risks and benefits discussed and monitors and equipment checked  Epidural Patient position: sitting Prep: site prepped and draped and DuraPrep Patient monitoring: continuous pulse ox and blood pressure Approach: midline Injection technique: LOR air  Needle:  Needle type: Tuohy  Needle gauge: 17 G Needle length: 9 cm and 9 Needle insertion depth: 7 cm Catheter type: closed end flexible Catheter size: 19 Gauge Catheter at skin depth: 12 cm Test dose: negative and Other  Assessment Sensory level: T9 Events: blood not aspirated, injection not painful, no injection resistance, negative IV test and no paresthesia  Additional Notes Reason for block:procedure for pain

## 2012-11-02 NOTE — Progress Notes (Signed)
Christina Olsen is a 22 Olsen.o. G3P0020 at [redacted]w[redacted]d admitted for active labor  Subjective: No complaints.  Comfortable with epidural.  Objective: BP 142/84  Pulse 78  Temp(Src) 97.8 F (36.6 C) (Oral)  Resp 14  Ht 5\' 4"  (1.626 m)  Wt 101.776 kg (224 lb 6 oz)  BMI 38.49 kg/m2  SpO2 96%  LMP 01/27/2012      FHT:  FHR: 110s-120 bpm, variability: moderate,  accelerations:  Present,  decelerations:  Absent (good scalp stim) UC:   regular, every 2-3 minutes SVE:   Dilation: 5 Effacement (%): 90;100 Station: -2 Exam by:: Lance Morin, MD AROM clear fluid and IUPC placed  Labs: Lab Results  Component Value Date   WBC 15.5* 11/02/2012   HGB 11.7* 11/02/2012   HCT 34.8* 11/02/2012   MCV 78.4 11/02/2012   PLT 135* 11/02/2012    Assessment / Plan: Augmentation of labor continue to titrate pitocin per protocol  Labor: Progressing on Pitocin, will continue to increase then AROM Fetal Wellbeing:  Category I Pain Control:  Epidural I/D:  n/a Anticipated MOD:  NSVD  Christina Olsen 11/02/2012, 6:12 PM

## 2012-11-02 NOTE — Progress Notes (Signed)
Patient ID: Christina Olsen, female   DOB: 08/18/1990, 22 y.o.   MRN: [id number]  Pt feels contractions q2-67min.  Requests nubain.  AFVSS  VE 4/90/-3 FHT cat 1  Will continue to observe and recheck in a few hours and if not progressing, will augment with pitocin. Fetal status is overall reassuring

## 2012-11-03 ENCOUNTER — Encounter (HOSPITAL_COMMUNITY): Payer: Self-pay | Admitting: *Deleted

## 2012-11-03 DIAGNOSIS — Z98891 History of uterine scar from previous surgery: Secondary | ICD-10-CM

## 2012-11-03 LAB — CBC
HCT: 32.8 % — ABNORMAL LOW (ref 36.0–46.0)
Hemoglobin: 11.3 g/dL — ABNORMAL LOW (ref 12.0–15.0)
Hemoglobin: 10.2 g/dL — ABNORMAL LOW (ref 12.0–15.0)
MCH: 26.2 pg (ref 26.0–34.0)
Platelets: 121 10*3/uL — ABNORMAL LOW (ref 150–400)
RBC: 4.17 MIL/uL (ref 3.87–5.11)
RBC: 3.9 MIL/uL (ref 3.87–5.11)
WBC: 17.6 10*3/uL — ABNORMAL HIGH (ref 4.0–10.5)
WBC: 14.6 10*3/uL — ABNORMAL HIGH (ref 4.0–10.5)

## 2012-11-03 MED ORDER — FERROUS SULFATE 325 (65 FE) MG PO TABS
325.0000 mg | ORAL_TABLET | Freq: Two times a day (BID) | ORAL | Status: DC
  Administered 2012-11-03 – 2012-11-05 (×5): 325 mg via ORAL
  Filled 2012-11-03 (×4): qty 1
  Filled 2012-11-03: qty 8

## 2012-11-03 MED ORDER — LACTATED RINGERS IV SOLN: 500.0000 mL | INTRAVENOUS | Status: DC | PRN

## 2012-11-03 MED ORDER — SIMETHICONE 80 MG PO CHEW
80.0000 mg | CHEWABLE_TABLET | Freq: Three times a day (TID) | ORAL | Status: DC
  Administered 2012-11-03 – 2012-11-05 (×7): 80 mg via ORAL

## 2012-11-03 MED ORDER — OXYTOCIN 40 UNITS IN LACTATED RINGERS INFUSION - SIMPLE MED: 62.5000 mL/h | INTRAVENOUS | Status: AC

## 2012-11-03 MED ORDER — OXYTOCIN 40 UNITS IN LACTATED RINGERS INFUSION - SIMPLE MED: 62.5000 mL/h | INTRAVENOUS | Status: DC

## 2012-11-03 MED ORDER — DIPHENHYDRAMINE HCL 50 MG/ML IJ SOLN: 25.0000 mg | INTRAMUSCULAR | Status: DC | PRN

## 2012-11-03 MED ORDER — MEPERIDINE HCL 25 MG/ML IJ SOLN: 6.2500 mg | INTRAMUSCULAR | Status: DC | PRN

## 2012-11-03 MED ORDER — NALOXONE HCL 0.4 MG/ML IJ SOLN: 0.4000 mg | INTRAMUSCULAR | Status: DC | PRN

## 2012-11-03 MED ORDER — SODIUM CHLORIDE 0.9 % IJ SOLN: 3.0000 mL | INTRAMUSCULAR | Status: DC | PRN

## 2012-11-03 MED ORDER — LACTATED RINGERS IV SOLN: INTRAVENOUS | Status: DC

## 2012-11-03 MED ORDER — OXYTOCIN BOLUS FROM INFUSION: 500.0000 mL | INTRAVENOUS | Status: DC

## 2012-11-03 MED ORDER — LIDOCAINE HCL (PF) 1 % IJ SOLN
30.0000 mL | INTRAMUSCULAR | Status: DC | PRN
  Filled 2012-11-03: qty 30

## 2012-11-03 MED ORDER — ONDANSETRON HCL 4 MG PO TABS: 4.0000 mg | ORAL_TABLET | ORAL | Status: DC | PRN

## 2012-11-03 MED ORDER — NALOXONE HCL 1 MG/ML IJ SOLN
1.0000 ug/kg/h | INTRAVENOUS | Status: DC | PRN
  Filled 2012-11-03: qty 2

## 2012-11-03 MED ORDER — ONDANSETRON HCL 4 MG/2ML IJ SOLN: 4.0000 mg | INTRAMUSCULAR | Status: DC | PRN

## 2012-11-03 MED ORDER — DIPHENHYDRAMINE HCL 25 MG PO CAPS: 25.0000 mg | ORAL_CAPSULE | Freq: Four times a day (QID) | ORAL | Status: DC | PRN

## 2012-11-03 MED ORDER — SIMETHICONE 80 MG PO CHEW
80.0000 mg | CHEWABLE_TABLET | ORAL | Status: DC | PRN
  Administered 2012-11-04 (×2): 80 mg via ORAL

## 2012-11-03 MED ORDER — OXYCODONE-ACETAMINOPHEN 5-325 MG PO TABS
1.0000 | ORAL_TABLET | ORAL | Status: DC | PRN
  Administered 2012-11-04: 1 via ORAL
  Filled 2012-11-03: qty 1
  Filled 2012-11-03: qty 2
  Filled 2012-11-03 (×4): qty 1

## 2012-11-03 MED ORDER — WITCH HAZEL-GLYCERIN EX PADS: 1.0000 "application " | MEDICATED_PAD | CUTANEOUS | Status: DC | PRN

## 2012-11-03 MED ORDER — OXYCODONE-ACETAMINOPHEN 5-325 MG PO TABS
1.0000 | ORAL_TABLET | ORAL | Status: DC | PRN
  Administered 2012-11-03: 1 via ORAL
  Administered 2012-11-04: 2 via ORAL
  Administered 2012-11-04 – 2012-11-05 (×3): 1 via ORAL

## 2012-11-03 MED ORDER — TETANUS-DIPHTH-ACELL PERTUSSIS 5-2.5-18.5 LF-MCG/0.5 IM SUSP: 0.5000 mL | Freq: Once | INTRAMUSCULAR | Status: DC

## 2012-11-03 MED ORDER — BISACODYL 10 MG RE SUPP: 10.0000 mg | Freq: Every day | RECTAL | Status: DC | PRN

## 2012-11-03 MED ORDER — MENTHOL 3 MG MT LOZG: 1.0000 | LOZENGE | OROMUCOSAL | Status: DC | PRN

## 2012-11-03 MED ORDER — FENTANYL CITRATE 0.05 MG/ML IJ SOLN
25.0000 ug | INTRAMUSCULAR | Status: DC | PRN
  Administered 2012-11-03 (×2): 50 ug via INTRAVENOUS

## 2012-11-03 MED ORDER — KETOROLAC TROMETHAMINE 30 MG/ML IJ SOLN
INTRAMUSCULAR | Status: AC
  Filled 2012-11-03: qty 1

## 2012-11-03 MED ORDER — DIBUCAINE 1 % RE OINT: 1.0000 "application " | TOPICAL_OINTMENT | RECTAL | Status: DC | PRN

## 2012-11-03 MED ORDER — FENTANYL CITRATE 0.05 MG/ML IJ SOLN
INTRAMUSCULAR | Status: AC
  Filled 2012-11-03: qty 2

## 2012-11-03 MED ORDER — ONDANSETRON HCL 4 MG/2ML IJ SOLN: 4.0000 mg | Freq: Four times a day (QID) | INTRAMUSCULAR | Status: DC | PRN

## 2012-11-03 MED ORDER — NALBUPHINE HCL 10 MG/ML IJ SOLN
5.0000 mg | INTRAMUSCULAR | Status: DC | PRN
  Administered 2012-11-03: 10 mg via INTRAVENOUS
  Filled 2012-11-03 (×2): qty 1

## 2012-11-03 MED ORDER — SCOPOLAMINE 1 MG/3DAYS TD PT72
1.0000 | MEDICATED_PATCH | Freq: Once | TRANSDERMAL | Status: DC
  Administered 2012-11-03: 1.5 mg via TRANSDERMAL

## 2012-11-03 MED ORDER — KETOROLAC TROMETHAMINE 30 MG/ML IJ SOLN: 30.0000 mg | Freq: Four times a day (QID) | INTRAMUSCULAR | Status: AC | PRN

## 2012-11-03 MED ORDER — SCOPOLAMINE 1 MG/3DAYS TD PT72
MEDICATED_PATCH | TRANSDERMAL | Status: AC
  Filled 2012-11-03: qty 1

## 2012-11-03 MED ORDER — PROMETHAZINE HCL 25 MG/ML IJ SOLN: 6.2500 mg | INTRAMUSCULAR | Status: DC | PRN

## 2012-11-03 MED ORDER — MEASLES, MUMPS & RUBELLA VAC ~~LOC~~ INJ
0.5000 mL | INJECTION | Freq: Once | SUBCUTANEOUS | Status: DC
  Filled 2012-11-03: qty 0.5

## 2012-11-03 MED ORDER — DIPHENHYDRAMINE HCL 25 MG PO CAPS
25.0000 mg | ORAL_CAPSULE | ORAL | Status: DC | PRN
  Filled 2012-11-03: qty 1

## 2012-11-03 MED ORDER — NALBUPHINE HCL 10 MG/ML IJ SOLN
5.0000 mg | INTRAMUSCULAR | Status: DC | PRN
  Administered 2012-11-03: 10 mg via SUBCUTANEOUS
  Filled 2012-11-03 (×2): qty 1

## 2012-11-03 MED ORDER — CITRIC ACID-SODIUM CITRATE 334-500 MG/5ML PO SOLN: 30.0000 mL | ORAL | Status: DC | PRN

## 2012-11-03 MED ORDER — LANOLIN HYDROUS EX OINT: 1.0000 "application " | TOPICAL_OINTMENT | CUTANEOUS | Status: DC | PRN

## 2012-11-03 MED ORDER — DIPHENHYDRAMINE HCL 50 MG/ML IJ SOLN: 12.5000 mg | INTRAMUSCULAR | Status: DC | PRN

## 2012-11-03 MED ORDER — ACETAMINOPHEN 325 MG PO TABS: 650.0000 mg | ORAL_TABLET | ORAL | Status: DC | PRN

## 2012-11-03 MED ORDER — ONDANSETRON HCL 4 MG/2ML IJ SOLN: 4.0000 mg | Freq: Three times a day (TID) | INTRAMUSCULAR | Status: DC | PRN

## 2012-11-03 MED ORDER — FLEET ENEMA 7-19 GM/118ML RE ENEM: 1.0000 | ENEMA | RECTAL | Status: DC | PRN

## 2012-11-03 MED ORDER — FLEET ENEMA 7-19 GM/118ML RE ENEM: 1.0000 | ENEMA | Freq: Every day | RECTAL | Status: DC | PRN

## 2012-11-03 MED ORDER — SENNOSIDES-DOCUSATE SODIUM 8.6-50 MG PO TABS
2.0000 | ORAL_TABLET | Freq: Every day | ORAL | Status: DC
  Administered 2012-11-03 – 2012-11-04 (×2): 2 via ORAL

## 2012-11-03 MED ORDER — IBUPROFEN 600 MG PO TABS
600.0000 mg | ORAL_TABLET | Freq: Four times a day (QID) | ORAL | Status: DC | PRN
  Filled 2012-11-03 (×8): qty 1

## 2012-11-03 MED ORDER — DIPHENHYDRAMINE HCL 50 MG/ML IJ SOLN
INTRAMUSCULAR | Status: AC
  Filled 2012-11-03: qty 1

## 2012-11-03 MED ORDER — MIDAZOLAM HCL 2 MG/2ML IJ SOLN: 0.5000 mg | Freq: Once | INTRAMUSCULAR | Status: DC | PRN

## 2012-11-03 MED ORDER — ZOLPIDEM TARTRATE 5 MG PO TABS: 5.0000 mg | ORAL_TABLET | Freq: Every evening | ORAL | Status: DC | PRN

## 2012-11-03 MED ORDER — KETOROLAC TROMETHAMINE 30 MG/ML IJ SOLN
30.0000 mg | Freq: Four times a day (QID) | INTRAMUSCULAR | Status: AC | PRN
  Administered 2012-11-03 (×2): 30 mg via INTRAVENOUS
  Filled 2012-11-03: qty 1

## 2012-11-03 MED ORDER — PRENATAL MULTIVITAMIN CH
1.0000 | ORAL_TABLET | Freq: Every day | ORAL | Status: DC
  Filled 2012-11-03: qty 1

## 2012-11-03 MED ORDER — IBUPROFEN 600 MG PO TABS
600.0000 mg | ORAL_TABLET | Freq: Four times a day (QID) | ORAL | Status: DC
  Administered 2012-11-03 – 2012-11-05 (×8): 600 mg via ORAL

## 2012-11-03 MED ORDER — METOCLOPRAMIDE HCL 5 MG/ML IJ SOLN: 10.0000 mg | Freq: Three times a day (TID) | INTRAMUSCULAR | Status: DC | PRN

## 2012-11-03 NOTE — Transfer of Care (Signed)
Immediate Anesthesia Transfer of Care Note  Patient: Christina Olsen  Procedure(s) Performed: Procedure(s): CESAREAN SECTION-Baby boy @2327  Apgar 9/9 (N/A)  Patient Location: PACU  Anesthesia Type:Epidural  Level of Consciousness: awake, alert , oriented and patient cooperative  Airway & Oxygen Therapy: Patient Spontanous Breathing  Post-op Assessment: Report given to PACU RN, Post -op Vital signs reviewed and stable and Patient moving all extremities  Post vital signs: Reviewed and stable  Complications: No apparent anesthesia complications

## 2012-11-03 NOTE — OR Nursing (Signed)
Fundus frim @ umbilius message C. Teyanna Thielman-RN. Minimal bleeding peri-pad in place.

## 2012-11-03 NOTE — Anesthesia Postprocedure Evaluation (Signed)
Anesthesia Post Note  Patient: Christina Olsen  Procedure(s) Performed: Procedure(s) (LRB): CESAREAN SECTION-Baby boy @2327  Apgar 9/9 (N/A)  Anesthesia type: Epidural  Patient location: PACU  Post pain: Pain level controlled  Post assessment: Post-op Vital signs reviewed  Last Vitals:  Filed Vitals:   11/02/12 2241  BP: 133/76  Pulse: 150  Temp:   Resp: 18    Post vital signs: Reviewed  Level of consciousness: awake  Complications: No apparent anesthesia complications

## 2012-11-03 NOTE — Op Note (Signed)
Cesarean Section Procedure Note   Christina Olsen  11/02/2012  Indications: Dystocia and chorioamnionitis   Pre-operative Diagnosis: Arrest of acute labor.   Post-operative Diagnosis: Same   Surgeon: Surgeon(s) and Role:    * Michael Litter, MD - Primary   Assistants: Manfred Arch CNM  Anesthesia: epidural   Procedure Details:  The patient was seen in the Holding Room. The risks, benefits, complications, treatment options, and expected outcomes were discussed with the patient. The patient concurred with the proposed plan, giving informed consent. identified as Caesar Bookman and the procedure verified as C-Section Delivery. A Time Out was held and the above information confirmed.  After induction of anesthesia, the patient was draped and prepped in the usual sterile manner. A transverse incision was made and carried down through the subcutaneous tissue to the fascia. Fascial incision was made in the midline and extended transversely. The fascia was separated from the underlying rectus muscle superiorly and inferiorly. The peritoneum was identified and entered. Peritoneal incision was extended longitudinally with good visualization of bowel and bladder. The utero-vesical peritoneal reflection was incised transversely and the bladder flap was bluntly freed from the lower uterine segment.  An alexsis retractor was placed in the abdomen.   A low transverse uterine incision was made. Delivered from cephalic, OP presentation was a  infant, with Apgar scores of 9 at one minute and 9 at five minutes. Cord ph was not sent the umbilical cord was clamped and cut cord blood was obtained for evaluation. The placenta was removed Intact and appeared normal. The uterine outline, tubes and ovaries appeared normal}. The uterine incision was closed with running locked sutures of 0Vicryl. A second layer 0 vicrlyl was used to imbricate the uterine incision    Hemostasis was observed. Lavage was carried out until  clear. The alexsis was removed.  The peritoneum was closed with 0 chromic.  The muscles were examined and any bleeders were made hemostatic using bovie cautery device.   The fascia was then reapproximated with running sutures of 0 vicryl.  The subcutaneous tissue was reapproximated  With interrupted stitches using 2-0 plain gut. The subcuticular closure was performed using 3-43monocryl     Instrument, sponge, and needle counts were correct prior the abdominal closure and were correct at the conclusion of the case.    Findings: infant was delivered from vtx OP presentation. The fluid was clear.  The uterus tubes and ovaries appeared normal.     Estimated Blood Loss: 800 cc  Total IV Fluids:   Urine Output: 100CC OF clear urine  Specimens: placenta to pathology  Complications: no complications  Disposition: PACU - hemodynamically stable.   Maternal Condition: stable   Baby condition / location:  nursery-stable  Attending Attestation: I was present and scrubbed for the entire procedure.   Signed: Surgeon(s): Michael Litter, MD

## 2012-11-03 NOTE — Anesthesia Postprocedure Evaluation (Signed)
  Anesthesia Post-op Note  Patient: Christina Olsen  Procedure(s) Performed: Procedure(s): CESAREAN SECTION-Baby boy @2327  Apgar 9/9 (N/A)  Patient Location: Mother/Baby  Anesthesia Type:Epidural  Level of Consciousness: awake  Airway and Oxygen Therapy: Patient Spontanous Breathing  Post-op Pain: mild  Post-op Assessment: Patient's Cardiovascular Status Stable and Respiratory Function Stable  Post-op Vital Signs: stable  Complications: No apparent anesthesia complications

## 2012-11-04 NOTE — Progress Notes (Signed)
Subjective: Postpartum Day 2: Cesarean Delivery Patient reports incisional pain, tolerating PO, + BM and no problems voiding.  Pt is breast feeding and plans nexplanon, she thinks.  Objective: Vital signs in last 24 hours: Temp:  [98.1 F (36.7 C)-98.3 F (36.8 C)] 98.2 F (36.8 C) (08/31 0500) Pulse Rate:  [83-95] 90 (08/31 0500) Resp:  [18] 18 (08/31 0500) BP: (110-142)/(67-76) 124/76 mmHg (08/31 0500) SpO2:  [97 %-98 %] 97 % (08/31 0021)  Physical Exam:  General: alert and no distress Lochia: appropriate Uterine Fundus: firm Incision: healing well, staining stable on dressing DVT Evaluation: No evidence of DVT seen on physical exam.   Recent Labs  11/03/12 0038 11/03/12 0605  HGB 11.3* 10.2*  HCT 32.8* 30.5*    Assessment/Plan: Status post Cesarean section. Doing well postoperatively.  Continue current care.  Purcell Nails 11/04/2012, 12:23 PM

## 2012-11-05 ENCOUNTER — Encounter (HOSPITAL_COMMUNITY): Payer: Self-pay | Admitting: Obstetrics and Gynecology

## 2012-11-05 MED ORDER — FERROUS SULFATE 325 (65 FE) MG PO TABS: 325.0000 mg | ORAL_TABLET | Freq: Two times a day (BID) | ORAL | Status: DC

## 2012-11-05 MED ORDER — OXYCODONE-ACETAMINOPHEN 5-325 MG PO TABS: 1.0000 | ORAL_TABLET | ORAL | Status: DC | PRN

## 2012-11-05 MED ORDER — IBUPROFEN 600 MG PO TABS: 600.0000 mg | ORAL_TABLET | Freq: Four times a day (QID) | ORAL | Status: DC | PRN

## 2012-11-05 NOTE — Progress Notes (Signed)
UR chart review completed.  

## 2012-11-05 NOTE — Discharge Summary (Signed)
Obstetric Discharge Summary Reason for Admission: onset of labor Prenatal Procedures: ultrasound Intrapartum Procedures: cesarean: low cervical, transverse Postpartum Procedures: none Complications-Operative and Postpartum: none Hemoglobin  Date Value Range Status  11/03/2012 10.2* 12.0 - 15.0 g/dL Final     HCT  Date Value Range Status  11/03/2012 30.5* 36.0 - 46.0 % Final    Physical Exam:  General: alert and cooperative Lochia: appropriate Uterine Fundus: firm Incision: healing well, no significant drainage DVT Evaluation: No evidence of DVT seen on physical exam. CV RRR LUNGS CTAB  Hospital Course:   The patient came in labor and had a CS for 40 WKS, CTXS, DEHYDRATED, WEAK by DR Normand Sloop MD.  Post operatively she did well.  She tolerated a regular diet and her exam is WNL and documented in the chart. .  She has recovered well and is ready for discharge.  She is BREAST feeding and will use NEXPLANON for BC.     Discharge Diagnoses: Term Pregnancy-delivered  Discharge Information: Date: 11/05/2012 Activity: pelvic rest Diet: routine Medications: PNV, Ibuprofen, Colace, Iron and Percocet Condition: stable Instructions: refer to practice specific booklet Discharge to: home Follow-up Information   Follow up with [facility]. Schedule an appointment as soon as possible for a visit in 6 weeks.   Specialty:  Obstetrics and Gynecology   Contact information:   [address] [place] [address] [phone]      Newborn Data: Live born female  Birth Weight: 7 lb 7.9 oz (3399 g) APGAR: 9, 9  Home with mother.  Bernyce Brimley A 11/05/2012, 11:26 AM

## 2013-01-10 ENCOUNTER — Other Ambulatory Visit: Payer: Self-pay

## 2013-06-20 ENCOUNTER — Emergency Department (HOSPITAL_COMMUNITY)
Admission: EM | Admit: 2013-06-20 | Discharge: 2013-06-20 | Discharge status: Against Medical Advice | Payer: Medicaid Other | Attending: Emergency Medicine | Admitting: Emergency Medicine

## 2013-06-20 ENCOUNTER — Encounter (HOSPITAL_COMMUNITY): Payer: Self-pay | Admitting: Emergency Medicine

## 2013-06-20 DIAGNOSIS — H5710 Ocular pain, unspecified eye: Secondary | ICD-10-CM | POA: Insufficient documentation

## 2013-06-20 DIAGNOSIS — H5789 Other specified disorders of eye and adnexa: Secondary | ICD-10-CM | POA: Insufficient documentation

## 2013-06-20 DIAGNOSIS — M79609 Pain in unspecified limb: Secondary | ICD-10-CM | POA: Insufficient documentation

## 2013-06-20 DIAGNOSIS — Z87891 Personal history of nicotine dependence: Secondary | ICD-10-CM | POA: Insufficient documentation

## 2013-06-20 NOTE — ED Notes (Signed)
Pt reports pain and drainage in both eyes since this am. 3  Raised areas of painful, warm skin on both legs.

## 2013-06-20 NOTE — Progress Notes (Signed)
[facility] provided pt with a list of primary care resources. Patient stated that she was pending Medicaid.  °

## 2013-06-20 NOTE — ED Notes (Signed)
Explained to pt how acuity works because she was upset due to no one coming in the room other than the nurse for 52 mins. Explained to pt doctor was in another room with a pt and would be in as soon as possible. Pt stated she did not want to wait and was leaving. Tried to et pt to sign AMA form, pt read and refused to sign form. Pt left walking out of front door.

## 2013-09-10 ENCOUNTER — Ambulatory Visit: Payer: Self-pay | Admitting: Family Medicine

## 2013-09-19 ENCOUNTER — Ambulatory Visit: Payer: Self-pay

## 2014-01-06 ENCOUNTER — Encounter (HOSPITAL_COMMUNITY): Payer: Self-pay | Admitting: Emergency Medicine

## 2014-01-22 ENCOUNTER — Inpatient Hospital Stay (HOSPITAL_COMMUNITY): Payer: Medicaid Other

## 2014-01-22 ENCOUNTER — Encounter (HOSPITAL_COMMUNITY): Payer: Self-pay | Admitting: *Deleted

## 2014-01-22 ENCOUNTER — Inpatient Hospital Stay (HOSPITAL_COMMUNITY)
Admission: AD | Admit: 2014-01-22 | Discharge: 2014-01-22 | Disposition: A | Discharge status: Home / Self Care | Payer: Medicaid Other | Source: Ambulatory Visit | Attending: Family Medicine | Admitting: Family Medicine

## 2014-01-22 DIAGNOSIS — Z87891 Personal history of nicotine dependence: Secondary | ICD-10-CM | POA: Insufficient documentation

## 2014-01-22 DIAGNOSIS — R109 Unspecified abdominal pain: Secondary | ICD-10-CM | POA: Diagnosis present

## 2014-01-22 DIAGNOSIS — N940 Mittelschmerz: Secondary | ICD-10-CM | POA: Diagnosis not present

## 2014-01-22 LAB — CBC
HEMATOCRIT: 40.1 % (ref 36.0–46.0)
Hemoglobin: 13.4 g/dL (ref 12.0–15.0)
MCH: 26.4 pg (ref 26.0–34.0)
MCHC: 33.4 g/dL (ref 30.0–36.0)
MCV: 79.1 fL (ref 78.0–100.0)
Platelets: 177 10*3/uL (ref 150–400)
RBC: 5.07 MIL/uL (ref 3.87–5.11)
RDW: 14.6 % (ref 11.5–15.5)
WBC: 12.9 10*3/uL — AB (ref 4.0–10.5)

## 2014-01-22 LAB — WET PREP, GENITAL
Trich, Wet Prep: NONE SEEN
Yeast Wet Prep HPF POC: NONE SEEN

## 2014-01-22 LAB — URINALYSIS, ROUTINE W REFLEX MICROSCOPIC
Bilirubin Urine: NEGATIVE
GLUCOSE, UA: NEGATIVE mg/dL
KETONES UR: NEGATIVE mg/dL
Leukocytes, UA: NEGATIVE
Nitrite: NEGATIVE
PROTEIN: NEGATIVE mg/dL
Specific Gravity, Urine: >1.03 — ABNORMAL HIGH (ref 1.005–1.030)
Urobilinogen, UA: 0.2 mg/dL (ref 0.0–1.0)
pH: 6 (ref 5.0–8.0)

## 2014-01-22 LAB — COMPREHENSIVE METABOLIC PANEL
ALBUMIN: 3.7 g/dL (ref 3.5–5.2)
ALK PHOS: 95 U/L (ref 39–117)
ALT: 16 U/L (ref 0–35)
AST: 12 U/L (ref 0–37)
Anion gap: 10 (ref 5–15)
BILIRUBIN TOTAL: 0.2 mg/dL — AB (ref 0.3–1.2)
BUN: 10 mg/dL (ref 6–23)
CHLORIDE: 102 meq/L (ref 96–112)
CO2: 25 mEq/L (ref 19–32)
Calcium: 8.8 mg/dL (ref 8.4–10.5)
Creatinine, Ser: 0.79 mg/dL (ref 0.50–1.10)
GFR calc Af Amer: >90 mL/min (ref >90)
GFR calc non Af Amer: >90 mL/min (ref >90)
Glucose, Bld: 98 mg/dL (ref 70–99)
POTASSIUM: 4.1 meq/L (ref 3.7–5.3)
SODIUM: 137 meq/L (ref 137–147)
Total Protein: 6.9 g/dL (ref 6.0–8.3)

## 2014-01-22 LAB — URINE MICROSCOPIC-ADD ON

## 2014-01-22 LAB — POCT PREGNANCY, URINE: Preg Test, Ur: NEGATIVE

## 2014-01-22 LAB — HIV ANTIBODY (ROUTINE TESTING W REFLEX): HIV: NONREACTIVE

## 2014-01-22 MED ORDER — IBUPROFEN 800 MG PO TABS: 800.0000 mg | ORAL_TABLET | Freq: Three times a day (TID) | ORAL | Status: DC

## 2014-01-22 MED ORDER — KETOROLAC TROMETHAMINE 60 MG/2ML IM SOLN
60.0000 mg | Freq: Once | INTRAMUSCULAR | Status: AC
  Administered 2014-01-22: 60 mg via INTRAMUSCULAR
  Filled 2014-01-22: qty 2

## 2014-01-22 NOTE — MAU Note (Signed)
Pt presents saying she is "sick and in pain."  States having nausea, vomiting, and left upper abdominal/back pain for 3 days, no c/o diarrhea.

## 2014-01-22 NOTE — Discharge Instructions (Signed)
Abdominal Pain, Women °Abdominal (stomach, pelvic, or belly) pain can be caused by many things. It is important to tell your doctor: °· The location of the pain. °· Does it come and go or is it present all the time? °· Are there things that start the pain (eating certain foods, exercise)? °· Are there other symptoms associated with the pain (fever, nausea, vomiting, diarrhea)? °All of this is helpful to know when trying to find the cause of the pain. °CAUSES  °· Stomach: virus or bacteria infection, or ulcer. °· Intestine: appendicitis (inflamed appendix), regional ileitis (Crohn's disease), ulcerative colitis (inflamed colon), irritable bowel syndrome, diverticulitis (inflamed diverticulum of the colon), or cancer of the stomach or intestine. °· Gallbladder disease or stones in the gallbladder. °· Kidney disease, kidney stones, or infection. °· Pancreas infection or cancer. °· Fibromyalgia (pain disorder). °· Diseases of the female organs: °¨ Uterus: fibroid (non-cancerous) tumors or infection. °¨ Fallopian tubes: infection or tubal pregnancy. °¨ Ovary: cysts or tumors. °¨ Pelvic adhesions (scar tissue). °¨ Endometriosis (uterus lining tissue growing in the pelvis and on the pelvic organs). °¨ Pelvic congestion syndrome (female organs filling up with blood just before the menstrual period). °¨ Pain with the menstrual period. °¨ Pain with ovulation (producing an egg). °¨ Pain with an IUD (intrauterine device, birth control) in the uterus. °¨ Cancer of the female organs. °· Functional pain (pain not caused by a disease, may improve without treatment). °· Psychological pain. °· Depression. °DIAGNOSIS  °Your doctor will decide the seriousness of your pain by doing an examination. °· Blood tests. °· X-rays. °· Ultrasound. °· CT scan (computed tomography, special type of X-ray). °· MRI (magnetic resonance imaging). °· Cultures, for infection. °· Barium enema (dye inserted in the large intestine, to better view it with  X-rays). °· Colonoscopy (looking in intestine with a lighted tube). °· Laparoscopy (minor surgery, looking in abdomen with a lighted tube). °· Major abdominal exploratory surgery (looking in abdomen with a large incision). °TREATMENT  °The treatment will depend on the cause of the pain.  °· Many cases can be observed and treated at home. °· Over-the-counter medicines recommended by your caregiver. °· Prescription medicine. °· Antibiotics, for infection. °· Birth control pills, for painful periods or for ovulation pain. °· Hormone treatment, for endometriosis. °· Nerve blocking injections. °· Physical therapy. °· Antidepressants. °· Counseling with a psychologist or psychiatrist. °· Minor or major surgery. °HOME CARE INSTRUCTIONS  °· Do not take laxatives, unless directed by your caregiver. °· Take over-the-counter pain medicine only if ordered by your caregiver. Do not take aspirin because it can cause an upset stomach or bleeding. °· Try a clear liquid diet (broth or water) as ordered by your caregiver. Slowly move to a bland diet, as tolerated, if the pain is related to the stomach or intestine. °· Have a thermometer and take your temperature several times a day, and record it. °· Bed rest and sleep, if it helps the pain. °· Avoid sexual intercourse, if it causes pain. °· Avoid stressful situations. °· Keep your follow-up appointments and tests, as your caregiver orders. °· If the pain does not go away with medicine or surgery, you may try: °¨ Acupuncture. °¨ Relaxation exercises (yoga, meditation). °¨ Group therapy. °¨ Counseling. °SEEK MEDICAL CARE IF:  °· You notice certain foods cause stomach pain. °· Your home care treatment is not helping your pain. °· You need stronger pain medicine. °· You want your IUD removed. °· You feel faint or   lightheaded. °· You develop nausea and vomiting. °· You develop a rash. °· You are having side effects or an allergy to your medicine. °SEEK IMMEDIATE MEDICAL CARE IF:  °· Your  pain does not go away or gets worse. °· You have a fever. °· Your pain is felt only in portions of the abdomen. The right side could possibly be appendicitis. The left lower portion of the abdomen could be colitis or diverticulitis. °· You are passing blood in your stools (bright red or black tarry stools, with or without vomiting). °· You have blood in your urine. °· You develop chills, with or without a fever. °· You pass out. °MAKE SURE YOU:  °· Understand these instructions. °· Will watch your condition. °· Will get help right away if you are not doing well or get worse. °Document Released: 12/19/2006 Document Revised: 07/08/2013 Document Reviewed: 01/08/2009 °[employer]® Patient Information ©2015 [employer]. This information is not intended to replace advice given to you by your health care provider. Make sure you discuss any questions you have with your health care provider. ° °

## 2014-01-22 NOTE — MAU Provider Note (Signed)
History     CSN: [id number]  Arrival date and time: 01/22/14 0714   First Provider Initiated Contact with Patient 01/22/14 [phone]      Chief Complaint  Patient presents with  . Abdominal Pain   HPI   [name] is a 23 y.o. female [phone] who presents with severe pain in her abdomen. She says the pain is on her left side and has been there for 3 days. She states that this morning the pain became worse that she has been unable to lay down or sleep; her pain is 10/10. The pain is accompanied by vomiting. The pain is constant. The patient was brought here by her boyfriend who dropped her off and went back to work. She has never had this pain before. She took motrin 2 days ago and that was 200 mg.  Last menstrual cycle was Nov 5  The last time she ate was yesterday afternoon and that was bo jangles.   OB History    Gravida Para Term Preterm AB TAB SAB Ectopic Multiple Living   '3 1 1  2  2   1      '$ Past Medical History  Diagnosis Date  . Chlamydia   . Trichimoniasis   . Urinary tract infection     Past Surgical History  Procedure Laterality Date  . Wisdom tooth extraction    . Fracture surgery  1999    right collarbone  . Cesarean section N/A 11/02/2012    Procedure: CESAREAN SECTION-Baby boy $RemoveBeforeDE'@[id number]'[id number]$  Apgar 9/9;  Surgeon: Betsy Coder, MD;  Location: [facility];  Service: Obstetrics;  Laterality: N/A;    Family History  Problem Relation Age of Onset  . Hypertension Mother   . Bipolar disorder Mother   . Hypertension Maternal Aunt   . Bipolar disorder Maternal Aunt   . Schizophrenia Maternal Aunt   . OCD Maternal Aunt   . Hypertension Maternal Uncle   . Hypertension Maternal Grandmother     History  Substance Use Topics  . Smoking status: Former Research scientist (life sciences)  . Smokeless tobacco: Never Used     Comment: black and mild  . Alcohol Use: No     Comment: occasional mixed drinks     Allergies:  Allergies  Allergen Reactions  . Shellfish-Derived Products  Anaphylaxis and Swelling  . Sulfa Antibiotics Other (See Comments)    Reaction unknown; told by mother she was allergic    Prescriptions prior to admission  Medication Sig Dispense Refill Last Dose  . acetaminophen (TYLENOL) 325 MG tablet Take 650 mg by mouth every 6 (six) hours as needed.   Past Week at Unknown time  . diphenhydrAMINE (BENADRYL) 25 mg capsule Take 25 mg by mouth every 6 (six) hours as needed for itching.   06/19/2013 at Unknown time  . Multiple Vitamin (ONE-A-DAY ESSENTIAL PO) Take 1 tablet by mouth daily.   06/20/2013 at Unknown time    Results for orders placed or performed during the hospital encounter of 01/22/14 (from the past 48 hour(s))  Urinalysis, Routine w reflex microscopic     Status: Abnormal   Collection Time: 01/22/14  7:20 AM  Result Value Ref Range   Color, Urine YELLOW YELLOW   APPearance HAZY (A) CLEAR   Specific Gravity, Urine >1.030 (H) 1.005 - 1.030   pH 6.0 5.0 - 8.0   Glucose, UA NEGATIVE NEGATIVE mg/dL   Hgb urine dipstick TRACE (A) NEGATIVE   Bilirubin Urine NEGATIVE NEGATIVE   Ketones, ur  NEGATIVE NEGATIVE mg/dL   Protein, ur NEGATIVE NEGATIVE mg/dL   Urobilinogen, UA 0.2 0.0 - 1.0 mg/dL   Nitrite NEGATIVE NEGATIVE   Leukocytes, UA NEGATIVE NEGATIVE  Urine microscopic-add on     Status: Abnormal   Collection Time: 01/22/14  7:20 AM  Result Value Ref Range   Squamous Epithelial / LPF FEW (A) RARE   RBC / HPF 0-2 <3 RBC/hpf   Bacteria, UA FEW (A) RARE   Urine-Other MUCOUS PRESENT   Pregnancy, urine POC     Status: None   Collection Time: 01/22/14  7:32 AM  Result Value Ref Range   Preg Test, Ur NEGATIVE NEGATIVE    Comment:        THE SENSITIVITY OF THIS METHODOLOGY IS >24 mIU/mL   CBC     Status: Abnormal   Collection Time: 01/22/14  8:47 AM  Result Value Ref Range   WBC 12.9 (H) 4.0 - 10.5 K/uL   RBC 5.07 3.87 - 5.11 MIL/uL   Hemoglobin 13.4 12.0 - 15.0 g/dL   HCT 40.1 36.0 - 46.0 %   MCV 79.1 78.0 - 100.0 fL   MCH 26.4  26.0 - 34.0 pg   MCHC 33.4 30.0 - 36.0 g/dL   RDW 14.6 11.5 - 15.5 %   Platelets 177 150 - 400 K/uL  Comprehensive metabolic panel     Status: Abnormal   Collection Time: 01/22/14  8:47 AM  Result Value Ref Range   Sodium 137 137 - 147 mEq/L   Potassium 4.1 3.7 - 5.3 mEq/L   Chloride 102 96 - 112 mEq/L   CO2 25 19 - 32 mEq/L   Glucose, Bld 98 70 - 99 mg/dL   BUN 10 6 - 23 mg/dL   Creatinine, Ser 0.79 0.50 - 1.10 mg/dL   Calcium 8.8 8.4 - 10.5 mg/dL   Total Protein 6.9 6.0 - 8.3 g/dL   Albumin 3.7 3.5 - 5.2 g/dL   AST 12 0 - 37 U/L   ALT 16 0 - 35 U/L   Alkaline Phosphatase 95 39 - 117 U/L   Total Bilirubin 0.2 (L) 0.3 - 1.2 mg/dL   GFR calc non Af Amer >90 >90 mL/min   GFR calc Af Amer >90 >90 mL/min    Comment: (NOTE) The eGFR has been calculated using the CKD EPI equation. This calculation has not been validated in all clinical situations. eGFR's persistently <90 mL/min signify possible Chronic Kidney Disease.    Anion gap 10 5 - 15  Wet prep, genital     Status: Abnormal   Collection Time: 01/22/14  9:40 AM  Result Value Ref Range   Yeast Wet Prep HPF POC NONE SEEN NONE SEEN   Trich, Wet Prep NONE SEEN NONE SEEN   Clue Cells Wet Prep HPF POC FEW (A) NONE SEEN   WBC, Wet Prep HPF POC FEW (A) NONE SEEN    Comment: FEW BACTERIA SEEN    US Transvaginal Non-ob  01/22/2014   CLINICAL DATA:  Left-sided abdominal pain for 3 days  EXAM: TRANSABDOMINAL AND TRANSVAGINAL ULTRASOUND OF PELVIS  TECHNIQUE: Both transabdominal and transvaginal ultrasound examinations of the pelvis were performed. Transabdominal technique was performed for global imaging of the pelvis including uterus, ovaries, adnexal regions, and pelvic cul-de-sac. It was necessary to proceed with endovaginal exam following the transabdominal exam to visualize the uterus, endometrium and ovaries.  COMPARISON:  06/25/2012  FINDINGS: Uterus  Measurements: 8.7 x 3.7 x 4.0 cm. No fibroids or other  mass visualized.   Endometrium  Thickness: 6.1 mm.  No focal abnormality visualized.  Right ovary  Measurements: 2 x 1.6 x 1.7 cm. Normal appearance.  No adnexal mass.  Left ovary  Measurements: 3.2 x 1.8 x 2.7 cm. Normal appearance. No adnexal mass.  Other findings  Small amount of free fluid noted.  IMPRESSION: 1. No findings to explain patient's left-sided abdominal pain. 2. Small amount of free fluid noted within the pelvis which may be physiologic in a pre menopausal female.   Electronically Signed   By: Kerby Moors M.D.   On: 01/22/2014 10:44   US Pelvis Complete  01/22/2014   CLINICAL DATA:  Left-sided abdominal pain for 3 days  EXAM: TRANSABDOMINAL AND TRANSVAGINAL ULTRASOUND OF PELVIS  TECHNIQUE: Both transabdominal and transvaginal ultrasound examinations of the pelvis were performed. Transabdominal technique was performed for global imaging of the pelvis including uterus, ovaries, adnexal regions, and pelvic cul-de-sac. It was necessary to proceed with endovaginal exam following the transabdominal exam to visualize the uterus, endometrium and ovaries.  COMPARISON:  06/25/2012  FINDINGS: Uterus  Measurements: 8.7 x 3.7 x 4.0 cm. No fibroids or other mass visualized.  Endometrium  Thickness: 6.1 mm.  No focal abnormality visualized.  Right ovary  Measurements: 2 x 1.6 x 1.7 cm. Normal appearance.  No adnexal mass.  Left ovary  Measurements: 3.2 x 1.8 x 2.7 cm. Normal appearance. No adnexal mass.  Other findings  Small amount of free fluid noted.  IMPRESSION: 1. No findings to explain patient's left-sided abdominal pain. 2. Small amount of free fluid noted within the pelvis which may be physiologic in a pre menopausal female.   Electronically Signed   By: Kerby Moors M.D.   On: 01/22/2014 10:44    Review of Systems  Constitutional: Negative for fever and chills.  Gastrointestinal: Positive for nausea, vomiting and abdominal pain. Negative for constipation and blood in stool.  Genitourinary: Negative for  dysuria, urgency, frequency and hematuria.   Physical Exam   Blood pressure 125/87, pulse 54, temperature 97.8 F (36.6 C), temperature source Oral, resp. rate 14, height $RemoveBe'5\' 6"'tzfaXWxUr$  (1.676 m), weight 92.987 kg (205 lb), currently breastfeeding.  Physical Exam  Constitutional: She is oriented to person, place, and time. She appears well-developed and well-nourished. No distress.  HENT:  Head: Normocephalic.  Eyes: Pupils are equal, round, and reactive to light.  Neck: Neck supple.  Respiratory: Effort normal.  GI: Soft. Normal appearance. There is tenderness in the left lower quadrant. There is rigidity. There is no rebound and no guarding.  Genitourinary:  Speculum exam: Vagina - Small amount of creamy discharge, no odor Cervix - No contact bleeding Bimanual exam: Cervix closed Uterus non tender, normal size Adnexa non tender, no masses bilaterally GC/Chlam, wet prep done Chaperone present for exam.   Musculoskeletal: Normal range of motion.  Neurological: She is alert and oriented to person, place, and time.  Skin: Skin is warm. She is not diaphoretic.  Psychiatric: Her behavior is normal.    MAU Course  Procedures  None  MDM Toradol 60 mg IM Wet prep GC CBC slightly elevated WBC Count; negative fever. No rebound tenderness  CMP HIV Pelvic US complete Pain relieved by toradol.   Assessment and Plan   A: 1. Mittelschmerz   2. Left sided abdominal pain    P: Discharge home in stable condition RX: Ibuprofen Go to [facility] if symptoms worsen   Darrelyn Hillock Azucena Dart, NP 01/22/2014 2:37  PM

## 2014-01-23 LAB — GC/CHLAMYDIA PROBE AMP
CT PROBE, AMP APTIMA: NEGATIVE
GC PROBE AMP APTIMA: NEGATIVE

## 2014-08-08 IMAGING — US US OB DETAIL+14 WK
1 series · 12 of 28 positions shown · non-contrast
Comparison: none

[Series 1: us ob detail +14 wk · 12 of 75 slices shown]
[im 3/75]
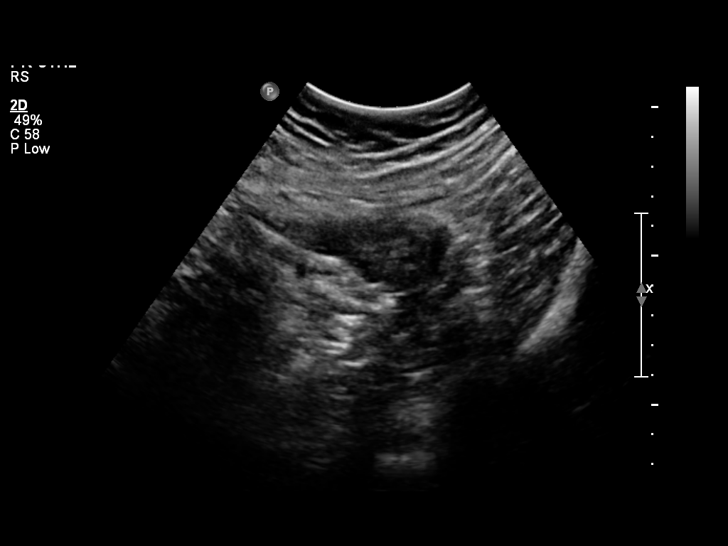
[im 9/75]
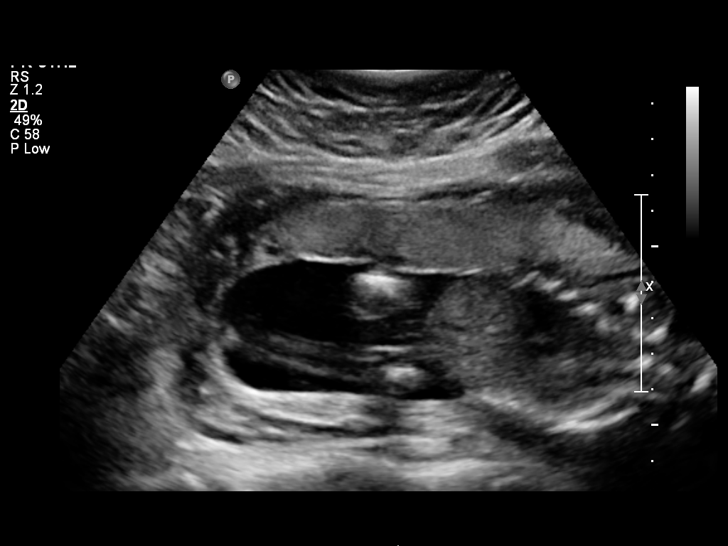
[im 14/75]
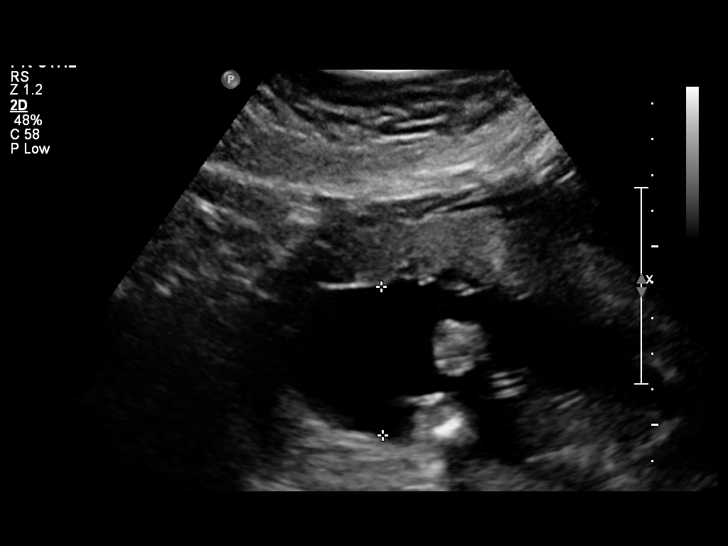
[im 22/75]
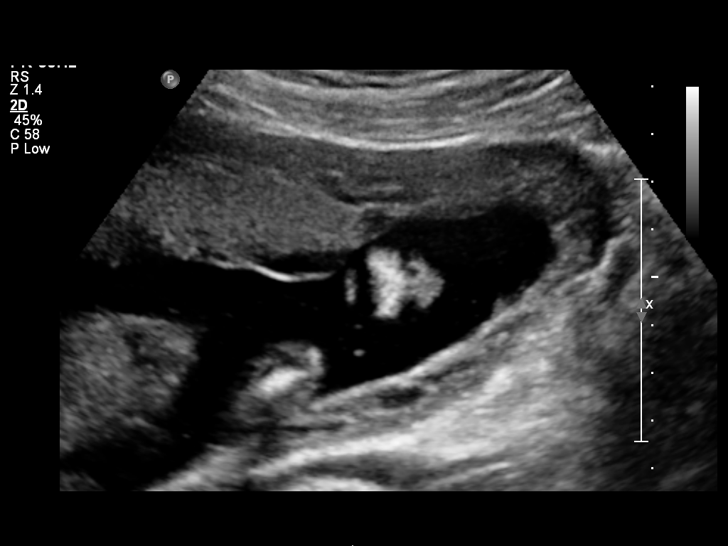
[im 28/75]
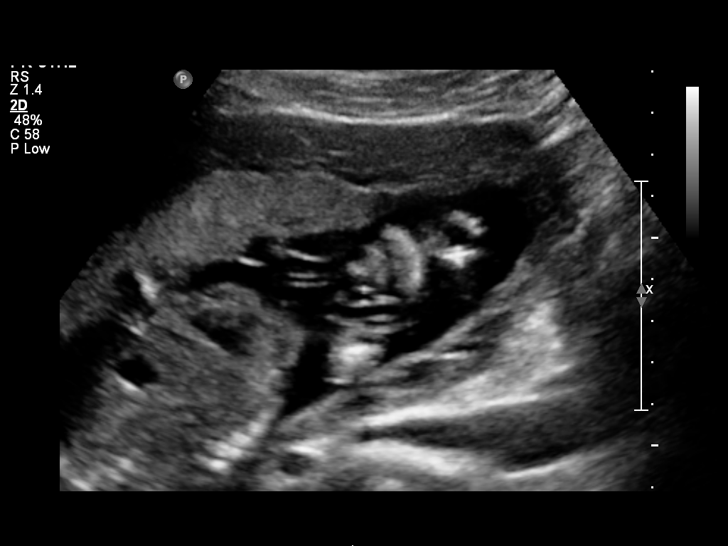
[im 33/75]
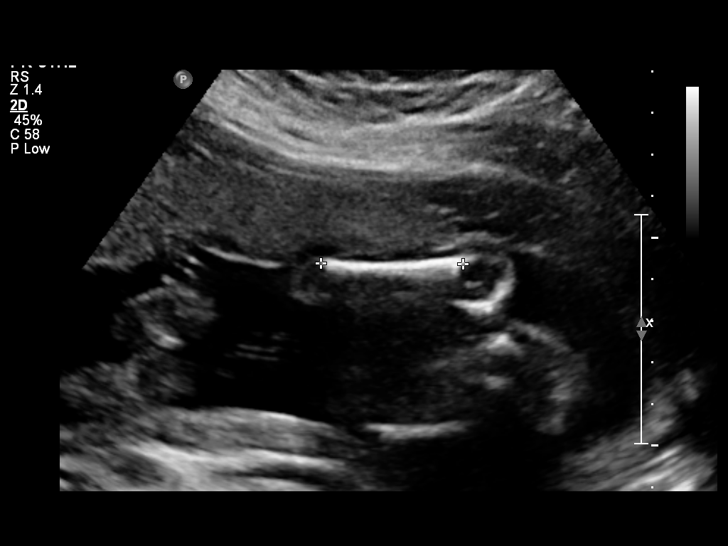
[im 42/75]
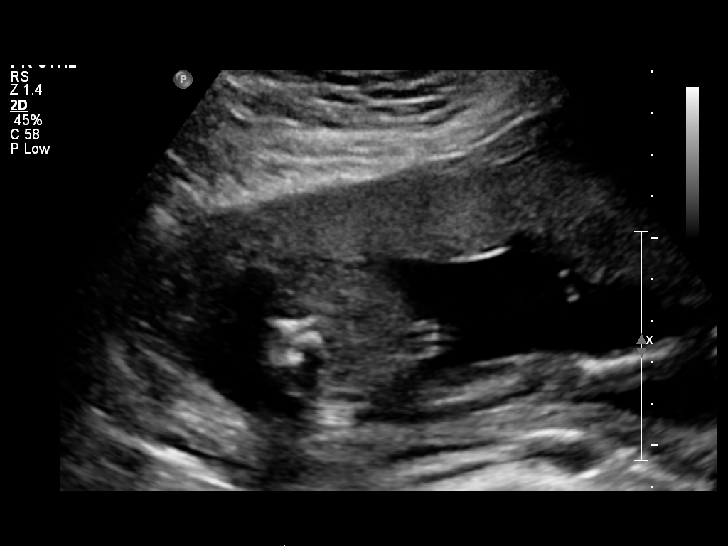
[im 47/75]
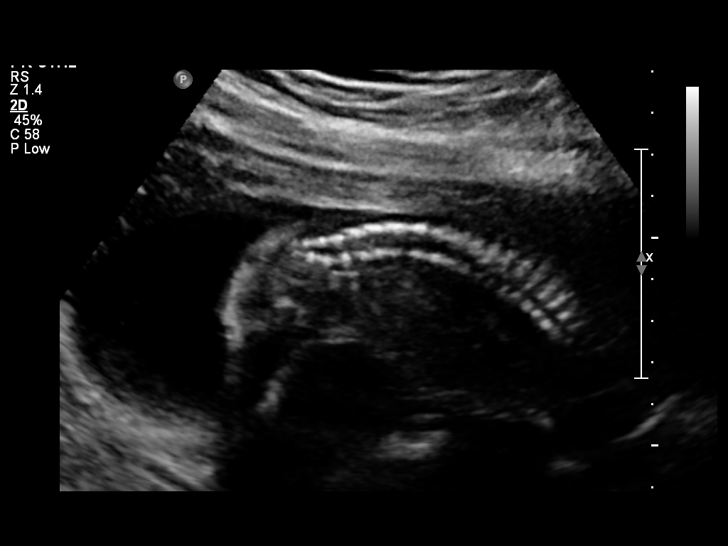
[im 53/75]
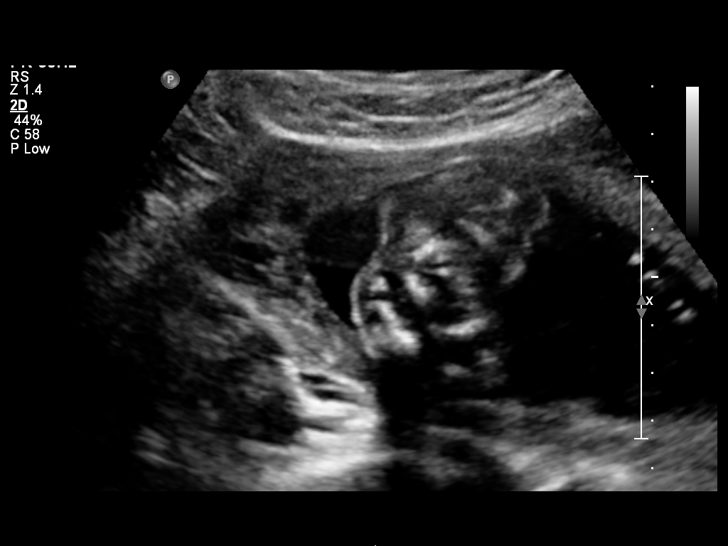
[im 61/75]
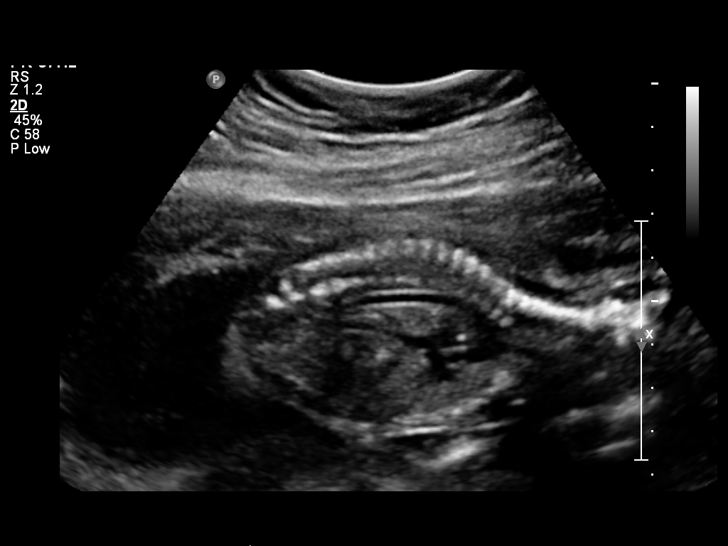
[im 66/75]
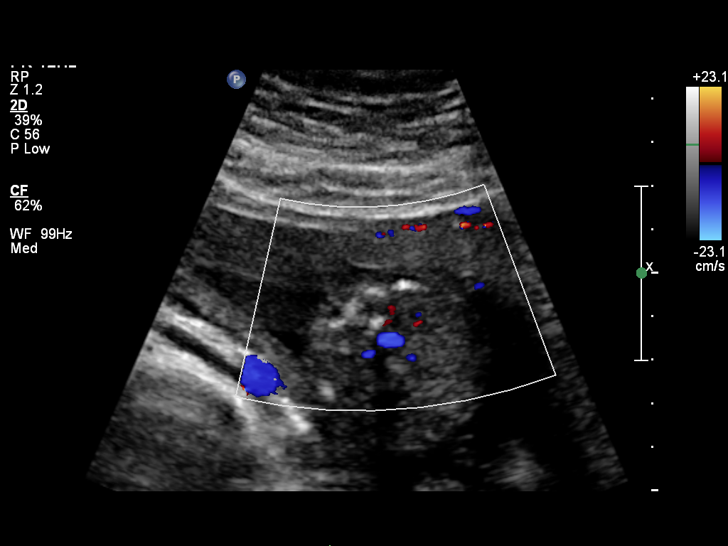
[im 72/75]
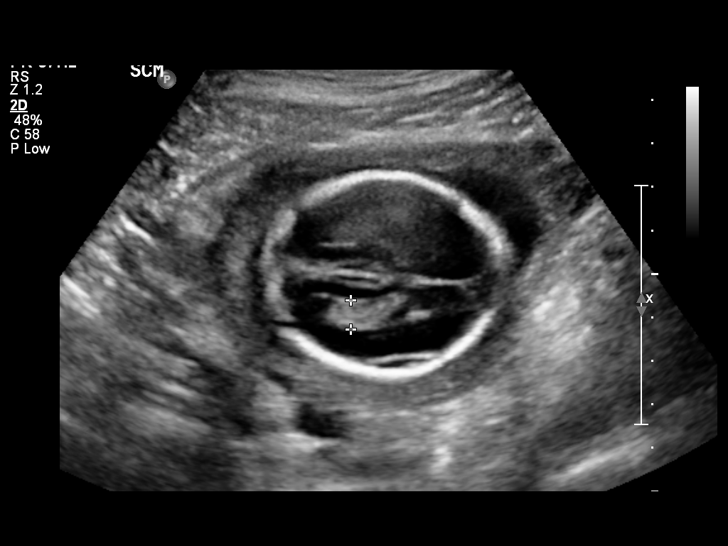

[12 of 28 positions shown; findings below may reference images not displayed]

OBSTETRICS REPORT
                      (Signed Final 06/11/2012 [DATE])

Service(s) Provided

 US OB DETAIL + 14 WK                                  76811.0
Indications

 Detailed fetal anatomic survey
 Obesity complicating pregnancy                        649.13,
Fetal Evaluation

 Num Of Fetuses:    1
 Fetal Heart Rate:  150                         bpm
 Cardiac Activity:  Observed
 Presentation:      Cephalic
 Placenta:          Anterior, above cervical os
 P. Cord            Visualized
 Insertion:

 Amniotic Fluid
 AFI FV:      Subjectively within normal limits
                                             Larg Pckt:     4.2  cm
Biometry

 BPD:     46.5  mm    G. Age:   20w 0d                CI:        73.52   70 - 86
                                                      FL/HC:      19.2   16.1 -

 HC:     172.3  mm    G. Age:   19w 6d       60  %    HC/AC:      1.20   1.09 -

 AC:     143.6  mm    G. Age:   19w 5d       54  %    FL/BPD:
 FL:        33  mm    G. Age:   20w 2d       74  %    FL/AC:      23.0   20 - 24
 HUM:       34  mm    G. Age:   21w 4d     > 95  %
 NFT:      2.2  mm

 Est. FW:     324  gm    0 lb 11 oz      55  %
Gestational Age

 LMP:           19w 3d       Date:   01/27/12                 EDD:   11/02/12
 U/S Today:     20w 0d                                        EDD:   10/29/12
 Best:          19w 3d    Det. By:   LMP  (01/27/12)          EDD:   11/02/12
Anatomy

 Cranium:          Appears normal         Aortic Arch:      Appears normal
 Fetal Cavum:      Appears normal         Ductal Arch:      Not well visualized
 Ventricles:       Appears normal         Diaphragm:        Appears normal
 Choroid Plexus:   Appears normal         Stomach:          Appears normal, left
                                                            sided
 Cerebellum:       Appears normal         Abdomen:          Appears normal
 Posterior Fossa:  Appears normal         Abdominal Wall:   Appears nml (cord
                                                            insert, abd wall)
 Nuchal Fold:      Appears normal         Cord Vessels:     Appears normal (3
                                                            vessel cord)
 Face:             Appears normal         Kidneys:          Appear normal
                   (orbits and profile)
 Lips:             Appears normal         Bladder:          Appears normal
 Heart:            Not well visualized    Spine:            Appears normal
 RVOT:             Appears normal         Lower             Appears normal
                                          Extremities:
 LVOT:             Appears normal         Upper             Appears normal
                                          Extremities:

 Other:  Male gender. Technically difficult due to  maternal habitus and early
         GA.
Cervix Uterus Adnexa

 Cervical Length:   4.5       cm

 Cervix:       Normal appearance by transabdominal scan.
 Left Ovary:   Within normal limits.
 Right Ovary:  Not visualized.

 Adnexa:     No abnormality visualized.
Impression

 Single live IUP in cephalic presentation.  Concordant
 measurements/assigned GA by LMP.
 No anatomic abnormality seen with a good quality survey
 possible. Fetal 4 chamber heart and ductal arch not well
 visualized due to fetal position. Recommend reassessment in
 1-2 weeks.

## 2014-08-13 ENCOUNTER — Inpatient Hospital Stay (HOSPITAL_COMMUNITY): Payer: Medicaid Other

## 2014-08-13 ENCOUNTER — Encounter (HOSPITAL_COMMUNITY): Payer: Self-pay | Admitting: *Deleted

## 2014-08-13 ENCOUNTER — Inpatient Hospital Stay (HOSPITAL_COMMUNITY)
Admission: AD | Admit: 2014-08-13 | Discharge: 2014-08-13 | Discharge status: Against Medical Advice | Payer: Medicaid Other | Source: Ambulatory Visit | Attending: Obstetrics and Gynecology | Admitting: Obstetrics and Gynecology

## 2014-08-13 DIAGNOSIS — O9989 Other specified diseases and conditions complicating pregnancy, childbirth and the puerperium: Secondary | ICD-10-CM | POA: Diagnosis not present

## 2014-08-13 DIAGNOSIS — R1032 Left lower quadrant pain: Secondary | ICD-10-CM | POA: Diagnosis not present

## 2014-08-13 DIAGNOSIS — Z3A08 8 weeks gestation of pregnancy: Secondary | ICD-10-CM | POA: Diagnosis not present

## 2014-08-13 DIAGNOSIS — O210 Mild hyperemesis gravidarum: Secondary | ICD-10-CM | POA: Diagnosis present

## 2014-08-13 DIAGNOSIS — N72 Inflammatory disease of cervix uteri: Secondary | ICD-10-CM

## 2014-08-13 DIAGNOSIS — N760 Acute vaginitis: Secondary | ICD-10-CM

## 2014-08-13 DIAGNOSIS — O26899 Other specified pregnancy related conditions, unspecified trimester: Secondary | ICD-10-CM

## 2014-08-13 DIAGNOSIS — O219 Vomiting of pregnancy, unspecified: Secondary | ICD-10-CM

## 2014-08-13 DIAGNOSIS — R109 Unspecified abdominal pain: Secondary | ICD-10-CM

## 2014-08-13 DIAGNOSIS — Z87891 Personal history of nicotine dependence: Secondary | ICD-10-CM | POA: Insufficient documentation

## 2014-08-13 DIAGNOSIS — B9689 Other specified bacterial agents as the cause of diseases classified elsewhere: Secondary | ICD-10-CM

## 2014-08-13 LAB — CBC
HEMATOCRIT: 37.5 % (ref 36.0–46.0)
HEMOGLOBIN: 13.1 g/dL (ref 12.0–15.0)
MCH: 26.9 pg (ref 26.0–34.0)
MCHC: 34.9 g/dL (ref 30.0–36.0)
MCV: 77 fL — AB (ref 78.0–100.0)
Platelets: 215 10*3/uL (ref 150–400)
RBC: 4.87 MIL/uL (ref 3.87–5.11)
RDW: 15.1 % (ref 11.5–15.5)
WBC: 13.1 10*3/uL — AB (ref 4.0–10.5)

## 2014-08-13 LAB — URINALYSIS, ROUTINE W REFLEX MICROSCOPIC
Bilirubin Urine: NEGATIVE
Glucose, UA: NEGATIVE mg/dL
HGB URINE DIPSTICK: NEGATIVE
KETONES UR: NEGATIVE mg/dL
Nitrite: NEGATIVE
PROTEIN: NEGATIVE mg/dL
Specific Gravity, Urine: 1.02 (ref 1.005–1.030)
Urobilinogen, UA: 0.2 mg/dL (ref 0.0–1.0)
pH: 7.5 (ref 5.0–8.0)

## 2014-08-13 LAB — URINE MICROSCOPIC-ADD ON

## 2014-08-13 LAB — HCG, QUANTITATIVE, PREGNANCY: hCG, Beta Chain, Quant, S: 92478 m[IU]/mL — ABNORMAL HIGH (ref <5)

## 2014-08-13 LAB — POCT PREGNANCY, URINE: Preg Test, Ur: POSITIVE — AB

## 2014-08-13 MED ORDER — ONDANSETRON HCL 4 MG PO TABS: 4.0000 mg | ORAL_TABLET | Freq: Four times a day (QID) | ORAL | Status: DC

## 2014-08-13 MED ORDER — METRONIDAZOLE 500 MG PO TABS: 500.0000 mg | ORAL_TABLET | Freq: Two times a day (BID) | ORAL | Status: DC

## 2014-08-13 NOTE — Discharge Instructions (Signed)

## 2014-08-13 NOTE — MAU Note (Signed)
Pt states she had a positive Home pregnancy test 1.5 weeks ago.  Has been very nauseated and vomits several times a day.  Denies vaginal bleeding.  Pt states she has a yeast infection and just took Diflucan today.

## 2014-08-13 NOTE — MAU Provider Note (Signed)
History     CSN: [id number]  Arrival date and time: 08/13/14 1226   None     Chief Complaint  Patient presents with  . Emesis   HPI  Pt is G4P1 at [redacted]w[redacted]d pregnant presents with positive home pregnancy test 1 1/2 weeks ago. Pt has been feeling nauseated and vomiting .  Pt also has some sharp abdominal pain intermittent- is dull now Pt denies vaginal bleeding  But thinks she has a yeast infection with large amount of white vaginal discharge. Pt took one Diflucan at home(went to Urgent care and dx with yeast infection) Pt is not sure how far along she is- pt had IC 2 times in April but not in MAY Nurse Signed  MAU Note 08/13/2014 12:36 PM    Expand All Collapse All   Pt states she had a positive Home pregnancy test 1.5 weeks ago. Has been very nauseated and vomits several times a day. Denies vaginal bleeding. Pt states she has a yeast infection and just took Diflucan today.         Past Medical History  Diagnosis Date  . Chlamydia   . Trichimoniasis   . Urinary tract infection     Past Surgical History  Procedure Laterality Date  . Wisdom tooth extraction    . Fracture surgery  1999    right collarbone  . Cesarean section N/A 11/02/2012    Procedure: CESAREAN SECTION-Baby boy  Apgar 9/9;  Surgeon: Michael Litter, MD;  Location: [facility];  Service: Obstetrics;  Laterality: N/A;    Family History  Problem Relation Age of Onset  . Hypertension Mother   . Bipolar disorder Mother   . Hypertension Maternal Aunt   . Bipolar disorder Maternal Aunt   . Schizophrenia Maternal Aunt   . OCD Maternal Aunt   . Hypertension Maternal Uncle   . Hypertension Maternal Grandmother     History  Substance Use Topics  . Smoking status: Former Games developer  . Smokeless tobacco: Never Used     Comment: black and mild  . Alcohol Use: No     Comment: occasional mixed drinks     Allergies:  Allergies  Allergen Reactions  . Shellfish-Derived Products Anaphylaxis and Swelling  .  Sulfa Antibiotics Other (See Comments)    Reaction unknown; told by mother she was allergic    Prescriptions prior to admission  Medication Sig Dispense Refill Last Dose  . acetaminophen (TYLENOL) 325 MG tablet Take 650 mg by mouth every 6 (six) hours as needed.   Past Week at Unknown time  . diphenhydrAMINE (BENADRYL) 25 mg capsule Take 25 mg by mouth every 6 (six) hours as needed for itching.   06/19/2013 at Unknown time  . ibuprofen (ADVIL,MOTRIN) 800 MG tablet Take 1 tablet (800 mg total) by mouth 3 (three) times daily. 21 tablet 0   . Multiple Vitamin (ONE-A-DAY ESSENTIAL PO) Take 1 tablet by mouth daily.   06/20/2013 at Unknown time    Review of Systems  Constitutional: Negative for fever and chills.  Gastrointestinal: Positive for nausea, vomiting and abdominal pain. Negative for diarrhea and constipation.  Genitourinary: Negative for dysuria and urgency.  Neurological: Negative for dizziness, tingling and headaches.   Physical Exam   Blood pressure 118/96, pulse 82, temperature 97.9 F (36.6 C), temperature source Oral, resp. rate 16, height  (1.676 m), weight 190 lb (86.183 kg), last menstrual period 05/28/2014, currently breastfeeding.  Physical Exam  Nursing note and vitals reviewed. Constitutional: She is  oriented to person, place, and time. She appears well-developed and well-nourished. No distress.  HENT:  Head: Normocephalic.  Eyes: Pupils are equal, round, and reactive to light.  Neck: Normal range of motion. Neck supple.  Cardiovascular: Normal rate.   Respiratory: Effort normal.  GI: Soft. She exhibits no distension. There is no rebound.  Musculoskeletal: Normal range of motion.  Neurological: She is alert and oriented to person, place, and time.  Skin: Skin is warm and dry.  Psychiatric: She has a normal mood and affect.    MAU Course  Procedures Results for orders placed or performed during the hospital encounter of 08/13/14 (from the past 24 hour(s))   Urinalysis, Routine w reflex microscopic (not at [facility])     Status: Abnormal   Collection Time: 08/13/14 12:30 PM  Result Value Ref Range   Color, Urine YELLOW YELLOW   APPearance CLEAR CLEAR   Specific Gravity, Urine 1.020 1.005 - 1.030   pH 7.5 5.0 - 8.0   Glucose, UA NEGATIVE NEGATIVE mg/dL   Hgb urine dipstick NEGATIVE NEGATIVE   Bilirubin Urine NEGATIVE NEGATIVE   Ketones, ur NEGATIVE NEGATIVE mg/dL   Protein, ur NEGATIVE NEGATIVE mg/dL   Urobilinogen, UA 0.2 0.0 - 1.0 mg/dL   Nitrite NEGATIVE NEGATIVE   Leukocytes, UA MODERATE (A) NEGATIVE  Urine microscopic-add on     Status: Abnormal   Collection Time: 08/13/14 12:30 PM  Result Value Ref Range   Squamous Epithelial / LPF FEW (A) RARE   WBC, UA 0-2 <3 WBC/hpf   Urine-Other AMORPHOUS URATES/PHOSPHATES   Pregnancy, urine POC     Status: Abnormal   Collection Time: 08/13/14 12:34 PM  Result Value Ref Range   Preg Test, Ur POSITIVE (A) NEGATIVE  CBC     Status: Abnormal   Collection Time: 08/13/14  2:00 PM  Result Value Ref Range   WBC 13.1 (H) 4.0 - 10.5 K/uL   RBC 4.87 3.87 - 5.11 MIL/uL   Hemoglobin 13.1 12.0 - 15.0 g/dL   HCT 16.1 09.6 - 04.5 %   MCV 77.0 (L) 78.0 - 100.0 fL   MCH 26.9 26.0 - 34.0 pg   MCHC 34.9 30.0 - 36.0 g/dL   RDW 40.9 81.1 - 91.4 %   Platelets 215 150 - 400 K/uL  pt left AMA - refused pelvic and refused to wait for results Pt states she already knows she has a yeast infection- went to [facility] US Ob Comp Less 14 Wks  08/13/2014   CLINICAL DATA:  Left lower quadrant pain for 2 weeks  EXAM: OBSTETRIC <14 WK ULTRASOUND  TECHNIQUE: Transabdominal ultrasound was performed for evaluation of the gestation as well as the maternal uterus and adnexal regions.  COMPARISON:  None.  FINDINGS: Intrauterine gestational sac: Single  Yolk sac:  Yes  Embryo:  Yes  Cardiac Activity: Yes  Heart Rate: 174 bpm  CRL:   20.3  mm   8 w 5 d                  [place] EDC: 03/20/2015  Maternal uterus/adnexae:  Subchorionic  hemorrhage: None  Right ovary: Normal  Left ovary: Not visualized  Other :Patient refused transvaginal portion of exam citing infection.  Free fluid:  None  IMPRESSION: 1. Single living intrauterine gestation. The estimated gestational age is 8 weeks and 5 days.   Electronically Signed   By: Signa Kell M.D.   On: 08/13/2014 14:56   Assessment and Plan  SLIUP-  [id number] Pt left AMA before results reviewed with pt- called pt on phone and gave results of US Pregnancy confirmation left for pt- pt to pick up  [facility] HillsINEBERRY,Larhonda Dettloff 08/13/2014, 1:12 PM

## 2014-08-14 LAB — HIV ANTIBODY (ROUTINE TESTING W REFLEX): HIV Screen 4th Generation wRfx: NONREACTIVE

## 2014-08-14 LAB — RPR: RPR Ser Ql: NONREACTIVE

## 2014-09-07 ENCOUNTER — Inpatient Hospital Stay (HOSPITAL_COMMUNITY)
Admission: AD | Admit: 2014-09-07 | Discharge: 2014-09-08 | Disposition: A | Discharge status: Home / Self Care | Payer: Medicaid Other | Source: Ambulatory Visit | Attending: Obstetrics & Gynecology | Admitting: Obstetrics & Gynecology

## 2014-09-07 ENCOUNTER — Encounter (HOSPITAL_COMMUNITY): Payer: Self-pay | Admitting: *Deleted

## 2014-09-07 ENCOUNTER — Inpatient Hospital Stay (HOSPITAL_COMMUNITY): Payer: Medicaid Other

## 2014-09-07 DIAGNOSIS — Z87891 Personal history of nicotine dependence: Secondary | ICD-10-CM | POA: Insufficient documentation

## 2014-09-07 DIAGNOSIS — O021 Missed abortion: Secondary | ICD-10-CM | POA: Diagnosis not present

## 2014-09-07 DIAGNOSIS — N939 Abnormal uterine and vaginal bleeding, unspecified: Secondary | ICD-10-CM | POA: Diagnosis present

## 2014-09-07 DIAGNOSIS — O4690 Antepartum hemorrhage, unspecified, unspecified trimester: Secondary | ICD-10-CM

## 2014-09-07 LAB — URINALYSIS, ROUTINE W REFLEX MICROSCOPIC
BILIRUBIN URINE: NEGATIVE
Glucose, UA: NEGATIVE mg/dL
KETONES UR: NEGATIVE mg/dL
Leukocytes, UA: NEGATIVE
Nitrite: NEGATIVE
PH: 6 (ref 5.0–8.0)
Protein, ur: NEGATIVE mg/dL
SPECIFIC GRAVITY, URINE: 1.025 (ref 1.005–1.030)
Urobilinogen, UA: 0.2 mg/dL (ref 0.0–1.0)

## 2014-09-07 LAB — URINE MICROSCOPIC-ADD ON: WBC, UA: NONE SEEN WBC/hpf (ref <3)

## 2014-09-07 MED ORDER — PROMETHAZINE HCL 25 MG PO TABS
12.5000 mg | ORAL_TABLET | Freq: Four times a day (QID) | ORAL | Status: DC | PRN
Start: 2014-09-07 — End: 2014-09-09

## 2014-09-07 MED ORDER — IBUPROFEN 800 MG PO TABS: 800.0000 mg | ORAL_TABLET | Freq: Three times a day (TID) | ORAL | Status: DC | PRN

## 2014-09-07 MED ORDER — HYDROCODONE-ACETAMINOPHEN 5-325 MG PO TABS: 1.0000 | ORAL_TABLET | Freq: Four times a day (QID) | ORAL | Status: DC | PRN

## 2014-09-07 MED ORDER — MISOPROSTOL 200 MCG PO TABS: 800.0000 ug | ORAL_TABLET | Freq: Once | ORAL | Status: DC

## 2014-09-07 NOTE — MAU Provider Note (Signed)
History     CSN: [id number]  Arrival date and time: 09/07/14 2053   First Provider Initiated Contact with Patient 09/07/14 2236      No chief complaint on file.  Vaginal Bleeding The patient's primary symptoms include vaginal bleeding. This is a new problem. The current episode started today (around 711930 today ). The problem occurs intermittently. The problem has been unchanged. The patient is experiencing no pain. She is pregnant. Pertinent negatives include no abdominal pain, constipation, diarrhea, dysuria, fever, frequency, nausea, urgency or vomiting. The vaginal discharge was scant. The vaginal bleeding is spotting. She has not been passing clots. She has not been passing tissue. Nothing aggravates the symptoms. She has tried nothing for the symptoms.      Past Medical History  Diagnosis Date  . Chlamydia   . Trichimoniasis   . Urinary tract infection     Past Surgical History  Procedure Laterality Date  . Wisdom tooth extraction    . Fracture surgery  1999    right collarbone  . Cesarean section N/A 11/02/2012    Procedure: CESAREAN SECTION-Baby boy @2327  Apgar 9/9;  Surgeon: Michael LitterNaima A Dillard, MD;  Location: [facility];  Service: Obstetrics;  Laterality: N/A;    Family History  Problem Relation Age of Onset  . Hypertension Mother   . Bipolar disorder Mother   . Hypertension Maternal Aunt   . Bipolar disorder Maternal Aunt   . Schizophrenia Maternal Aunt   . OCD Maternal Aunt   . Hypertension Maternal Uncle   . Hypertension Maternal Grandmother     History  Substance Use Topics  . Smoking status: Former Games developermoker  . Smokeless tobacco: Never Used     Comment: black and mild  . Alcohol Use: No     Comment: occasional mixed drinks     Allergies:  Allergies  Allergen Reactions  . Shellfish-Derived Products Anaphylaxis and Swelling  . Sulfa Antibiotics Other (See Comments)    Reaction:  Unknown     Prescriptions prior to admission  Medication Sig Dispense Refill  Last Dose  . calcium carbonate (TUMS - DOSED IN MG ELEMENTAL CALCIUM) 500 MG chewable tablet Chew 1 tablet by mouth daily as needed for indigestion or heartburn.   09/06/2014 at Unknown time  . diphenhydrAMINE (BENADRYL) 25 mg capsule Take 25 mg by mouth every 6 (six) hours as needed for itching or allergies.    More than a month at PRN  . ondansetron (ZOFRAN) 4 MG tablet Take 1 tablet (4 mg total) by mouth every 6 (six) hours. (Patient not taking: Reported on 09/07/2014) 30 tablet 0     Review of Systems  Constitutional: Negative for fever.  Gastrointestinal: Negative for nausea, vomiting, abdominal pain, diarrhea and constipation.  Genitourinary: Positive for vaginal bleeding. Negative for dysuria, urgency and frequency.   Physical Exam   Blood pressure 121/90, pulse 72, temperature 98.3 F (36.8 C), temperature source Oral, resp. rate 15, height 5\' 6"  (1.676 m), weight 93.895 kg (207 lb), last menstrual period 05/28/2014, SpO2 100 %, currently breastfeeding.  Physical Exam  Nursing note and vitals reviewed. Constitutional: She is oriented to person, place, and time. She appears well-developed and well-nourished.  HENT:  Head: Normocephalic.  Cardiovascular: Normal rate.   Respiratory: Effort normal.  GI: Soft. There is no tenderness. There is no rebound.  Neurological: She is alert and oriented to person, place, and time.  Skin: Skin is warm and dry.  Psychiatric: She has a normal mood and affect.  Results for orders placed or performed during the hospital encounter of 09/07/14 (from the past 24 hour(s))  Urinalysis, Routine w reflex microscopic (not at [facility])     Status: Abnormal   Collection Time: 09/07/14  9:15 PM  Result Value Ref Range   Color, Urine YELLOW YELLOW   APPearance CLEAR CLEAR   Specific Gravity, Urine 1.025 1.005 - 1.030   pH 6.0 5.0 - 8.0   Glucose, UA NEGATIVE NEGATIVE mg/dL   Hgb urine dipstick LARGE (A) NEGATIVE   Bilirubin Urine NEGATIVE NEGATIVE    Ketones, ur NEGATIVE NEGATIVE mg/dL   Protein, ur NEGATIVE NEGATIVE mg/dL   Urobilinogen, UA 0.2 0.0 - 1.0 mg/dL   Nitrite NEGATIVE NEGATIVE   Leukocytes, UA NEGATIVE NEGATIVE  Urine microscopic-add on     Status: Abnormal   Collection Time: 09/07/14  9:15 PM  Result Value Ref Range   Squamous Epithelial / LPF MANY (A) RARE   WBC, UA  <3 WBC/hpf    NO FORMED ELEMENTS SEEN ON URINE MICROSCOPIC EXAMINATION   RBC / HPF 0-2 <3 RBC/hpf   Bacteria, UA RARE RARE   US Ob Comp Less 14 Wks  09/07/2014   CLINICAL DATA:  Vaginal bleeding in first-trimester pregnancy. Patient declines transvaginal imaging.  EXAM: OBSTETRIC <14 WK ULTRASOUND  TECHNIQUE: Transabdominal ultrasound was performed for evaluation of the gestation as well as the maternal uterus and adnexal regions.  COMPARISON:  08/13/2014  FINDINGS: Intrauterine gestational sac: Visualized.  No subchorionic hematoma.  Yolk sac:  Not seen  Embryo:  Present  Cardiac Activity: Not visible by power Doppler or motion Doppler.  CRL: 21.2 mm 8 w 6 d. There has been no expected growth of the embryo since previous.  Maternal uterus/adnexae: No pathologic findings. Both ovaries appear normal. No free pelvic fluid.  IMPRESSION: Single intrauterine gestation with no cardiac activity or growth since 08/13/2014. Findings meet definitive criteria for failed pregnancy. This follows SRU consensus guidelines: Diagnostic Criteria for Nonviable Pregnancy Early in the First Trimester. [facility] [phone].   Electronically Signed   By: Marnee Spring M.D.   On: 09/07/2014 23:27    MAU Course  Procedures  MDM D/W the patient at length.She is undecided. She would like rx for cytotec and clinic appointment to discuss D&C.       Early Intrauterine Pregnancy Failure  x Documented intrauterine pregnancy failure less than or equal to [redacted] weeks gestation  xNo serious current illness  x  Baseline Hgb greater than or equal to 10g/dl  x  Patient has easily  accessible transportation to the hospital  x  Clear preference  x  Practitioner/physician deems patient reliable  x  Counseling by practitioner or physician  x  Patient education by RN  NA  Consent form signed  NA  Rho-Gam given by RN if indicated   ___ Medication dispensed   x  Cytotec 800 mcg  x   Intravaginally by patient at home         __   Intravaginally by RN in MAU        __   Rectally by patient at home        __   Rectally by RN in MAU  x  Ibuprofen 800 mg 1 tablet by mouth every 8 hours as needed #30  x  Hydrocodone/acetaminophen 5/325 mg by mouth every 4 to 6 hours as needed  x  Phenergan 12.5 mg by mouth every 4 hours as needed for  nausea           Assessment and Plan   1. Missed abortion   2. Vaginal bleeding in pregnancy    DC home Patient unsure about cytotec. She would like rx to take if she decides. Would like clinic appointment to discuss D&C.  Comfort measures reviewed  Bleeding precautions SAB precautions  RX: cytotec, phenergan, ibuprofen, hydrocodone  Return to MAU as needed FU with WOC:  Follow-up Information    Follow up with [facility].   Specialty:  Obstetrics and Gynecology   Why:  Wednesday 09/10/14 at 3:00   Contact information:   [address] [place] [address] [phone]        Tawnya Crook 09/07/2014, 10:37 PM

## 2014-09-07 NOTE — MAU Note (Signed)
Pt reports she started spotting about 3 hours ago and has a history of 2 miscarriages and just wanted to be checked. Denies pain.

## 2014-09-07 NOTE — Discharge Instructions (Signed)
Miscarriage A miscarriage is the sudden loss of an unborn baby (fetus) before the 20th week of pregnancy. Most miscarriages happen in the first 3 months of pregnancy. Sometimes, it happens before a woman even knows she is pregnant. A miscarriage is also called a "spontaneous miscarriage" or "early pregnancy loss." Having a miscarriage can be an emotional experience. Talk with your caregiver about any questions you may have about miscarrying, the grieving process, and your future pregnancy plans. CAUSES   Problems with the fetal chromosomes that make it impossible for the baby to develop normally. Problems with the baby's genes or chromosomes are most often the result of errors that occur, by chance, as the embryo divides and grows. The problems are not inherited from the parents.  Infection of the cervix or uterus.   Hormone problems.   Problems with the cervix, such as having an incompetent cervix. This is when the tissue in the cervix is not strong enough to hold the pregnancy.   Problems with the uterus, such as an abnormally shaped uterus, uterine fibroids, or congenital abnormalities.   Certain medical conditions.   Smoking, drinking alcohol, or taking illegal drugs.   Trauma.  Often, the cause of a miscarriage is unknown.  SYMPTOMS   Vaginal bleeding or spotting, with or without cramps or pain.  Pain or cramping in the abdomen or lower back.  Passing fluid, tissue, or blood clots from the vagina. DIAGNOSIS  Your caregiver will perform a physical exam. You may also have an ultrasound to confirm the miscarriage. Blood or urine tests may also be ordered. TREATMENT   Sometimes, treatment is not necessary if you naturally pass all the fetal tissue that was in the uterus. If some of the fetus or placenta remains in the body (incomplete miscarriage), tissue left behind may become infected and must be removed. Usually, a dilation and curettage (D and C) procedure is performed.  During a D and C procedure, the cervix is widened (dilated) and any remaining fetal or placental tissue is gently removed from the uterus.  Antibiotic medicines are prescribed if there is an infection. Other medicines may be given to reduce the size of the uterus (contract) if there is a lot of bleeding.  If you have Rh negative blood and your baby was Rh positive, you will need a Rh immunoglobulin shot. This shot will protect any future baby from having Rh blood problems in future pregnancies. HOME CARE INSTRUCTIONS   Your caregiver may order bed rest or may allow you to continue light activity. Resume activity as directed by your caregiver.  Have someone help with home and family responsibilities during this time.   Keep track of the number of sanitary pads you use each day and how soaked (saturated) they are. Write down this information.   Do not use tampons. Do not douche or have sexual intercourse until approved by your caregiver.   Only take over-the-counter or prescription medicines for pain or discomfort as directed by your caregiver.   Do not take aspirin. Aspirin can cause bleeding.   Keep all follow-up appointments with your caregiver.   If you or your partner have problems with grieving, talk to your caregiver or seek counseling to help cope with the pregnancy loss. Allow enough time to grieve before trying to get pregnant again.  SEEK IMMEDIATE MEDICAL CARE IF:   You have severe cramps or pain in your back or abdomen.  You have a fever.  You pass large blood clots (walnut-sized   or larger) ortissue from your vagina. Save any tissue for your caregiver to inspect.   Your bleeding increases.   You have a thick, bad-smelling vaginal discharge.  You become lightheaded, weak, or you faint.   You have chills.  MAKE SURE YOU:  Understand these instructions.  Will watch your condition.  Will get help right away if you are not doing well or get  worse. Document Released: 08/17/2000 Document Revised: 06/18/2012 Document Reviewed: 04/12/2011 ExitCare Patient Information 2015 [employer]. This information is not intended to replace advice given to you by your health care provider. Make sure you discuss any questions you have with your health care provider.  

## 2014-09-09 ENCOUNTER — Ambulatory Visit (HOSPITAL_COMMUNITY)
Admission: AD | Admit: 2014-09-09 | Discharge: 2014-09-10 | Disposition: A | Discharge status: Home / Self Care | Payer: Medicaid Other | Source: Ambulatory Visit | Attending: Family Medicine | Admitting: Family Medicine

## 2014-09-09 ENCOUNTER — Encounter (HOSPITAL_COMMUNITY): Payer: Self-pay

## 2014-09-09 DIAGNOSIS — Z3A12 12 weeks gestation of pregnancy: Secondary | ICD-10-CM | POA: Diagnosis not present

## 2014-09-09 DIAGNOSIS — Z791 Long term (current) use of non-steroidal anti-inflammatories (NSAID): Secondary | ICD-10-CM | POA: Diagnosis not present

## 2014-09-09 DIAGNOSIS — O021 Missed abortion: Secondary | ICD-10-CM | POA: Insufficient documentation

## 2014-09-09 DIAGNOSIS — Z79899 Other long term (current) drug therapy: Secondary | ICD-10-CM | POA: Insufficient documentation

## 2014-09-09 DIAGNOSIS — Z87891 Personal history of nicotine dependence: Secondary | ICD-10-CM | POA: Insufficient documentation

## 2014-09-09 DIAGNOSIS — Z87440 Personal history of urinary (tract) infections: Secondary | ICD-10-CM | POA: Diagnosis not present

## 2014-09-09 DIAGNOSIS — N854 Malposition of uterus: Secondary | ICD-10-CM | POA: Diagnosis not present

## 2014-09-09 DIAGNOSIS — Z79891 Long term (current) use of opiate analgesic: Secondary | ICD-10-CM | POA: Insufficient documentation

## 2014-09-09 NOTE — MAU Note (Signed)
Heavy vaginal bleeding all day; bad abdominal pain x 3 hours. Feels light headed. [facility]

## 2014-09-10 ENCOUNTER — Encounter (HOSPITAL_COMMUNITY)
Admission: AD | Disposition: A | Discharge status: Home / Self Care | Payer: Self-pay | Source: Ambulatory Visit | Attending: Family Medicine

## 2014-09-10 ENCOUNTER — Inpatient Hospital Stay (HOSPITAL_COMMUNITY): Payer: Medicaid Other | Admitting: Anesthesiology

## 2014-09-10 ENCOUNTER — Ambulatory Visit: Payer: Medicaid Other | Admitting: Family Medicine

## 2014-09-10 DIAGNOSIS — O021 Missed abortion: Secondary | ICD-10-CM | POA: Insufficient documentation

## 2014-09-10 DIAGNOSIS — Z791 Long term (current) use of non-steroidal anti-inflammatories (NSAID): Secondary | ICD-10-CM | POA: Diagnosis not present

## 2014-09-10 DIAGNOSIS — N854 Malposition of uterus: Secondary | ICD-10-CM

## 2014-09-10 DIAGNOSIS — Z3A12 12 weeks gestation of pregnancy: Secondary | ICD-10-CM | POA: Diagnosis not present

## 2014-09-10 HISTORY — PX: DILATION AND EVACUATION: SHX1459

## 2014-09-10 LAB — CBC
HCT: 37.6 % (ref 36.0–46.0)
Hemoglobin: 12.6 g/dL (ref 12.0–15.0)
MCH: 26.4 pg (ref 26.0–34.0)
MCHC: 33.5 g/dL (ref 30.0–36.0)
MCV: 78.8 fL (ref 78.0–100.0)
PLATELETS: 224 10*3/uL (ref 150–400)
RBC: 4.77 MIL/uL (ref 3.87–5.11)
RDW: 14.9 % (ref 11.5–15.5)
WBC: 11.9 10*3/uL — ABNORMAL HIGH (ref 4.0–10.5)

## 2014-09-10 LAB — PREPARE RBC (CROSSMATCH)

## 2014-09-10 SURGERY — DILATION AND EVACUATION, UTERUS
Anesthesia: Choice | Site: Vagina

## 2014-09-10 MED ORDER — ACETAMINOPHEN 160 MG/5ML PO SOLN: 325.0000 mg | ORAL | Status: DC | PRN

## 2014-09-10 MED ORDER — ONDANSETRON HCL 4 MG/2ML IJ SOLN
INTRAMUSCULAR | Status: DC | PRN
  Administered 2014-09-10: 4 mg via INTRAVENOUS

## 2014-09-10 MED ORDER — PROPOFOL 10 MG/ML IV BOLUS: INTRAVENOUS | Status: DC | PRN

## 2014-09-10 MED ORDER — HYDROMORPHONE HCL 1 MG/ML IJ SOLN: 2.0000 mg | Freq: Once | INTRAMUSCULAR | Status: DC

## 2014-09-10 MED ORDER — SODIUM CHLORIDE 0.9 % IV SOLN
INTRAVENOUS | Status: DC | PRN
  Administered 2014-09-10: 01:00:00 via INTRAVENOUS

## 2014-09-10 MED ORDER — LACTATED RINGERS IV SOLN
INTRAVENOUS | Status: DC | PRN
  Administered 2014-09-10: 01:00:00 via INTRAVENOUS

## 2014-09-10 MED ORDER — OXYCODONE-ACETAMINOPHEN 5-325 MG PO TABS: 1.0000 | ORAL_TABLET | ORAL | Status: DC | PRN

## 2014-09-10 MED ORDER — LIDOCAINE HCL (CARDIAC) 20 MG/ML IV SOLN
INTRAVENOUS | Status: DC | PRN
  Administered 2014-09-10 (×3): 30 mg via INTRAVENOUS
  Administered 2014-09-10: 10 mg via INTRAVENOUS

## 2014-09-10 MED ORDER — HYDROMORPHONE HCL 1 MG/ML IJ SOLN
2.0000 mg | Freq: Once | INTRAMUSCULAR | Status: AC
  Administered 2014-09-10: 2 mg via INTRAVENOUS

## 2014-09-10 MED ORDER — KETOROLAC TROMETHAMINE 30 MG/ML IJ SOLN
INTRAMUSCULAR | Status: DC | PRN
  Administered 2014-09-10: 30 mg via INTRAVENOUS

## 2014-09-10 MED ORDER — PROPOFOL 10 MG/ML IV BOLUS
INTRAVENOUS | Status: DC | PRN
  Administered 2014-09-10: 30 mg via INTRAVENOUS
  Administered 2014-09-10: 20 mg via INTRAVENOUS
  Administered 2014-09-10: 30 mg via INTRAVENOUS

## 2014-09-10 MED ORDER — DEXAMETHASONE SODIUM PHOSPHATE 4 MG/ML IJ SOLN: INTRAMUSCULAR | Status: DC | PRN

## 2014-09-10 MED ORDER — FENTANYL CITRATE (PF) 100 MCG/2ML IJ SOLN
25.0000 ug | INTRAMUSCULAR | Status: DC | PRN
  Administered 2014-09-10: 100 ug via INTRAVENOUS

## 2014-09-10 MED ORDER — OXYCODONE HCL 5 MG PO TABS: 5.0000 mg | ORAL_TABLET | Freq: Once | ORAL | Status: DC | PRN

## 2014-09-10 MED ORDER — BUPIVACAINE-EPINEPHRINE 0.25% -1:200000 IJ SOLN
INTRAMUSCULAR | Status: DC | PRN
  Administered 2014-09-10: 10 mL

## 2014-09-10 MED ORDER — MIDAZOLAM HCL 5 MG/5ML IJ SOLN
INTRAMUSCULAR | Status: DC | PRN
Start: 2014-09-10 — End: 2014-09-10
  Administered 2014-09-10: 2 mg via INTRAVENOUS

## 2014-09-10 MED ORDER — OXYCODONE HCL 5 MG/5ML PO SOLN: 5.0000 mg | Freq: Once | ORAL | Status: DC | PRN

## 2014-09-10 MED ORDER — ACETAMINOPHEN 325 MG PO TABS
325.0000 mg | ORAL_TABLET | ORAL | Status: DC | PRN
Start: 2014-09-10 — End: 2014-09-10

## 2014-09-10 MED ORDER — HYDROMORPHONE HCL 1 MG/ML IJ SOLN
INTRAMUSCULAR | Status: AC
  Administered 2014-09-10: 2 mg via INTRAVENOUS
  Filled 2014-09-10: qty 2

## 2014-09-10 MED ORDER — DOXYCYCLINE HYCLATE 100 MG IV SOLR
200.0000 mg | Freq: Once | INTRAVENOUS | Status: AC
  Administered 2014-09-10: 200 mg via INTRAVENOUS
  Filled 2014-09-10: qty 200

## 2014-09-10 MED ORDER — SODIUM CHLORIDE 0.9 % IV SOLN
Freq: Once | INTRAVENOUS | Status: AC
  Administered 2014-09-10: 01:00:00 via INTRAVENOUS

## 2014-09-10 SURGICAL SUPPLY — 18 items

## 2014-09-10 NOTE — Op Note (Signed)
Clovis FredricksonLarissa Shimone Detzel PROCEDURE DATE: 09/09/2014 - 09/10/2014  PREOPERATIVE DIAGNOSIS: 12 week missed abortion. POSTOPERATIVE DIAGNOSIS: The same. PROCEDURE:     Dilation and Evacuation. SURGEON:  Dr. Candelaria CelesteJacob Tranika Scholler  INDICATIONS: 24 y.o. G4P1021with MAB at [redacted] weeks gestation, needing surgical completion.  Risks of surgery were discussed with the patient including but not limited to: bleeding which may require transfusion; infection which may require antibiotics; injury to uterus or surrounding organs;need for additional procedures including laparotomy or laparoscopy; possibility of intrauterine scarring which may impair future fertility; and other postoperative/anesthesia complications. Written informed consent was obtained.    FINDINGS:  A 8 week size anteverted uterus, moderate amounts of products of conception, specimen sent to pathology.  ANESTHESIA:    Monitored intravenous sedation, paracervical block. INTRAVENOUS FLUIDS:  300 ml of LR ESTIMATED BLOOD LOSS:  Less than 20 ml. SPECIMENS:  Products of conception sent to pathology COMPLICATIONS:  None immediate.  PROCEDURE DETAILS:  The patient received intravenous antibiotics while in the preoperative area.  She was then taken to the operating room where general anesthesia was administered and was found to be adequate.  After an adequate timeout was performed, she was placed in the dorsal lithotomy position and examined; then prepped and draped in the sterile manner.   Her bladder was catheterized for an unmeasured amount of clear, yellow urine. A vaginal speculum was then placed in the patient's vagina and a paracervical block using 1% Marcaine was administered.  A single tooth tenaculum was then applied to the anterior lip of the cervix. The cervix was gently dilated to accommodate a 10 mm suction curette that was gently advanced to the uterine fundus.  The suction device was then activated and curette slowly rotated to clear the uterus of  products of conception.  A sharp curettage was then performed to confirm completer emptying of the uterus.There was minimal bleeding noted and the tenaculum removed with good hemostasis noted.  The patient tolerated the procedure well.  The patient was taken to the recovery area in stable condition.   Levie HeritageJacob J Magdelene Ruark, DO 09/10/2014 1:24 AM

## 2014-09-10 NOTE — H&P (Signed)
History     CSN: [id number]  Arrival date and time: 09/09/14 2337   First Provider Initiated Contact with Patient 09/09/14 2357      Chief Complaint  Patient presents with  . Miscarriage  . Vaginal Bleeding   HPI 24 year old G4 P1 021 at 12 weeks and 5 days by LMP who presents to the hospital with heavy vaginal bleeding. She was seen in the turn admissions unit on 7/3 and was diagnosed with a missed AB. She was scheduled to see a provider tomorrow in consult for a D&E. This evening she started having heavy vaginal bleeding and passing large clots. She has been feeling faint and lightheaded that is worse when she walks and better when she sits down.  OB History    Gravida Para Term Preterm AB TAB SAB Ectopic Multiple Living   Past Medical History  Diagnosis Date  . Chlamydia   . Trichimoniasis   . Urinary tract infection     Past Surgical History  Procedure Laterality Date  . Wisdom tooth extraction    . Fracture surgery  1999    right collarbone  . Cesarean section N/A 11/02/2012    Procedure: CESAREAN SECTION-Baby boy  Apgar 9/9;  Surgeon: Michael Litter, MD;  Location: [facility];  Service: Obstetrics;  Laterality: N/A;    Family History  Problem Relation Age of Onset  . Hypertension Mother   . Bipolar disorder Mother   . Hypertension Maternal Aunt   . Bipolar disorder Maternal Aunt   . Schizophrenia Maternal Aunt   . OCD Maternal Aunt   . Hypertension Maternal Uncle   . Hypertension Maternal Grandmother     History  Substance Use Topics  . Smoking status: Former Games developer  . Smokeless tobacco: Never Used     Comment: black and mild  . Alcohol Use: No     Comment: occasional mixed drinks     Allergies:  Allergies  Allergen Reactions  . Shellfish-Derived Products Anaphylaxis and Swelling  . Sulfa Antibiotics Other (See Comments)    Reaction:  Unknown     Prescriptions prior to admission  Medication Sig Dispense Refill Last  Dose  . ibuprofen (ADVIL,MOTRIN) 800 MG tablet Take 1 tablet (800 mg total) by mouth every 8 (eight) hours as needed. 30 tablet 0 09/09/2014 at 2200  . calcium carbonate (TUMS - DOSED IN MG ELEMENTAL CALCIUM) 500 MG chewable tablet Chew 1 tablet by mouth daily as needed for indigestion or heartburn.   09/06/2014 at Unknown time  . diphenhydrAMINE (BENADRYL) 25 mg capsule Take 25 mg by mouth every 6 (six) hours as needed for itching or allergies.    More than a month at PRN  . HYDROcodone-acetaminophen (NORCO/VICODIN) 5-325 MG per tablet Take 1-2 tablets by mouth every 6 (six) hours as needed. 20 tablet 0   . misoprostol (CYTOTEC) 200 MCG tablet Place 4 tablets (800 mcg total) vaginally once. 4 tablet 0     Review of Systems  Constitutional: Negative for fever and chills.  Respiratory: Negative for cough, shortness of breath and wheezing.   Cardiovascular: Negative for chest pain and palpitations.  Gastrointestinal: Positive for abdominal pain. Negative for heartburn, nausea, vomiting, diarrhea, constipation, blood in stool and melena.   Physical Exam   Temperature 98.1 F (36.7 C), temperature source Oral, resp. rate 20, height  (1.676 m), weight 207 lb (93.895 kg), last menstrual period  05/28/2014, not currently breastfeeding.  Physical Exam  Constitutional: She is oriented to person, place, and time. She appears well-developed and well-nourished. No distress.  HENT:  Head: Normocephalic and atraumatic.  Right Ear: External ear normal.  Left Ear: External ear normal.  Eyes: EOM are normal. Pupils are equal, round, and reactive to light. No scleral icterus.  Neck: Normal range of motion. Neck supple.  Cardiovascular: Normal rate and regular rhythm.  Exam reveals no gallop and no friction rub.   No murmur heard. Respiratory: Effort normal and breath sounds normal. No respiratory distress. She has no wheezes. She has no rales. She exhibits no tenderness.  GI: Soft. Bowel sounds are  normal. She exhibits no distension and no mass. There is tenderness. There is no rebound (pelvic tenderness) and no guarding.  Genitourinary: There is no rash, tenderness, lesion or injury on the right labia. There is no rash, tenderness, lesion or injury on the left labia. There is bleeding (bleeding clots from cervix) in the vagina. No erythema or tenderness in the vagina. No foreign body around the vagina. No signs of injury around the vagina. No vaginal discharge found.  No products of conception extruding from os. GU exam done by Rochele PagesWalidah Karim, CNM.  Musculoskeletal: She exhibits no edema or tenderness.  Neurological: She is alert and oriented to person, place, and time.  Skin: Skin is warm and dry.  Psychiatric: She has a normal mood and affect. Her behavior is normal. Judgment and thought content normal.   No results found for this or any previous visit (from the past 24 hour(s)).  MAU Course  Procedures  MDM   Assessment and Plan  #1 missed AB #2 symptomatic bleeding  We'll obtain type and screen, CBC. Crossmatch for 2 units of red blood cells as precaution.  As the patient is having heavy bleeding with symptoms, will proceed with dilation and evacuation. Patient last ate at 4 PM and last drank 4 hours ago. Risks of D&E discussed with the patient including bleeding, infection, uterine perforation, risk of damage to adjacent organs. Consent signed and on chart. Doxycycline ordered as preoperative antibiotics. OR notified. To operating room when ready. STINSON, JACOB JEHIEL 09/10/2014, 12:21 AM

## 2014-09-10 NOTE — Anesthesia Postprocedure Evaluation (Signed)
  Anesthesia Post-op Note  Patient: Christina Olsen  Procedure(s) Performed: Procedure(s): DILATATION AND EVACUATION (N/A)  Patient Location: PACU  Anesthesia Type:MAC  Level of Consciousness: awake  Airway and Oxygen Therapy: Patient Spontanous Breathing  Post-op Pain: none  Post-op Assessment: Post-op Vital signs reviewed, Patient's Cardiovascular Status Stable, Respiratory Function Stable, Patent Airway, No signs of Nausea or vomiting and Pain level controlled              Post-op Vital Signs: Reviewed and stable  Last Vitals:  Filed Vitals:   09/10/14 0340  BP:   Pulse:   Temp:   Resp: 20    Complications: No apparent anesthesia complications

## 2014-09-10 NOTE — Transfer of Care (Signed)
Immediate Anesthesia Transfer of Care Note  Patient: Christina Olsen  Procedure(s) Performed: Procedure(s): DILATATION AND EVACUATION (N/A)  Patient Location: PACU  Anesthesia Type:MAC  Level of Consciousness: awake, alert  and oriented  Airway & Oxygen Therapy: Patient Spontanous Breathing  Post-op Assessment: Report given to RN and Post -op Vital signs reviewed and stable  Post vital signs: Reviewed and stable  Last Vitals:  Filed Vitals:   09/09/14 2346  Temp: 36.7 C  Resp: 20    Complications: No apparent anesthesia complications

## 2014-09-10 NOTE — Anesthesia Preprocedure Evaluation (Signed)
Anesthesia Evaluation  Patient identified by MRN, date of birth, ID band Patient awake    Reviewed: Allergy & Precautions, NPO status , Patient's Chart, lab work & pertinent test results  History of Anesthesia Complications Negative for: history of anesthetic complications  Airway Mallampati: II  TM Distance: >3 FB Neck ROM: Full    Dental  (+) Teeth Intact   Pulmonary former smoker,  breath sounds clear to auscultation        Cardiovascular negative cardio ROS  Rhythm:Regular     Neuro/Psych negative neurological ROS  negative psych ROS   GI/Hepatic negative GI ROS, Neg liver ROS,   Endo/Other  negative endocrine ROS  Renal/GU negative Renal ROS     Musculoskeletal   Abdominal   Peds  Hematology negative hematology ROS (+)   Anesthesia Other Findings   Reproductive/Obstetrics (+) Pregnancy                             Anesthesia Physical Anesthesia Plan  ASA: II and emergent  Anesthesia Plan: MAC   Post-op Pain Management:    Induction: Intravenous  Airway Management Planned: Nasal Cannula  Additional Equipment: None  Intra-op Plan:   Post-operative Plan:   Informed Consent: I have reviewed the patients History and Physical, chart, labs and discussed the procedure including the risks, benefits and alternatives for the proposed anesthesia with the patient or authorized representative who has indicated his/her understanding and acceptance.   Dental advisory given  Plan Discussed with: CRNA and Surgeon  Anesthesia Plan Comments:         Anesthesia Quick Evaluation

## 2014-09-10 NOTE — Discharge Instructions (Signed)
D&E (Dilation and Evacuation) Dilation and evacuation (D&E) is a minor operation. It involves stretching (dilation) the cervix and evacuation of the uterus. During the procedure, the cervix is dilated and tissue is gently suctioned from the inside of the uterus.  REASONS FOR DOING D&E Removal of retained placenta after giving birth.  Abortion. Miscarriage.  RISKS AND COMPLICATIONS Putting a hole (perforation) in the uterus. Excessive bleeding after the D&E.  Infection of the uterus.  Damage to the cervix.  Developing scar tissue (adhesions) inside the uterus, later causing abnormal bleeding or no monthly bleeding (amenorrhea) or problems with fertility. Complications from general or local anesthetic.     PROCEDURE You may be given a drug to make you sleep (general anesthetic) or a drug that numbs the area (local anesthetic) in and around the cervix.  You will lie on your back with your legs in stirrups.  A curved tool (suction curette) will be used to evacuate the uterus and will then be removed.  This usually takes around 15 to 30 minutes.  AFTER THE PROCEDURE You will rest in the recovery room until you are stable and feel ready to go home.  You may feel sick to your stomach (nauseous) or throw up (vomit) if you had general anesthesia.  You may have light cramping and bleeding for a couple days to 2 weeks after the procedure.  Your uterus needs to make new lining after a D&E. This may make your next period late.   HOME CARE INSTRUCTIONS Do not drive for 24 hours.  Wait 1 week before returning to strenuous activities.  You may resume your usual diet.  Drink enough water and fluids to keep your urine clear or pale yellow.  You should return to your usual bowel function. If constipation occurs, you may:  Take a mild laxative with permission from your caregiver.  Add fruit and bran to your diet.  Take showers instead of baths for two weeks Do not go swimming or use a hot tub until  your caregiver gives you permission.  Have someone with you or available for you the first 24 to 48 hours, especially if you had a general anesthetic.  Do not douche, use tampons, or have intercourse until after your follow-up appointment, or when your caregiver approves.  Only take over-the-counter or prescription medicines for pain, discomfort, or fever as directed by your caregiver. Do not take aspirin. It can cause bleeding.  If a prescription has been given to you, follow your caregiver's directions. You may be given a medicine that kills germs (antibiotic) to prevent an infection.  Keep all your follow-up appointments recommended by your caregiver.   SEEK MEDICAL CARE IF: You have increasing cramps or pain not relieved with medicine.  You develop belly (abdominal) pain, which does not seem to be related to the same area as your earlier cramping and pain.  You feel dizzy or feel like fainting.  You have a bad smelling vaginal discharge.  You develop a rash.  You develop a reaction or allergy to your medicine.   SEEK IMMEDIATE MEDICAL CARE IF: Bleeding is heavier than a normal menstrual period.  You have an oral temperature above 101F, not controlled by medicine.  You develop chest pain.  You develop shortness of breath.  You pass out.  You develop heavy vaginal bleeding with or without blood clots.   MAKE SURE YOU: Understand these instructions.  Will watch your condition.  Will get help right away if you are   not doing well or get worse.   UPDATED HEALTH PRACTICES A Pap smear is done to screen for cervical cancer.  The first Pap smear should be done at age 21.  Between ages 21 and 29, Pap smears are repeated every 2 years.  Beginning at age 30, you are advised to have a Pap smear every 3 years as long as your past 3 Pap smears have been normal.  Some women have medical problems that increase the chance of getting cervical cancer. Talk to your caregiver about these problems. It  is especially important to talk to your caregiver if a new problem develops soon after your last Pap smear. In these cases, your caregiver may recommend more frequent screening and Pap smears.  The above recommendations are the same for women who have or have not gotten the vaccine for HPV (human papillomavirus).  If you had a uterus removal (hysterectomy) for a problem that was not a cancer or a condition that could lead to cancer, then you no longer need Pap smears.  If you are between ages 65 and 70, and you have had normal Pap smears going back 10 years, you no longer need Pap smears.  If you have had past treatment for cervical cancer or a condition that could lead to cancer, you need Pap smears and screening for cancer for at least 20 years after your treatment.  Continue monthly breast self-examinations. Your caregiver can provide information and instructions for breast self-examination.  [employer] Patient Information 2011 [employer].  

## 2014-09-10 NOTE — MAU Provider Note (Signed)
History     CSN: [id number]  Arrival date and time: 09/09/14 2337   First Provider Initiated Contact with Patient 09/09/14 2357      Chief Complaint  Patient presents with  . Miscarriage  . Vaginal Bleeding   HPI 24 year old G4 P1 021 at 12 weeks and 5 days by LMP who presents to the hospital with heavy vaginal bleeding. She was seen in the turn admissions unit on 7/3 and was diagnosed with a missed AB. She was scheduled to see a provider tomorrow in consult for a D&E. This evening she started having heavy vaginal bleeding and passing large clots. She has been feeling faint and lightheaded that is worse when she walks and better when she sits down.  OB History    Gravida Para Term Preterm AB TAB SAB Ectopic Multiple Living   Past Medical History  Diagnosis Date  . Chlamydia   . Trichimoniasis   . Urinary tract infection     Past Surgical History  Procedure Laterality Date  . Wisdom tooth extraction    . Fracture surgery  1999    right collarbone  . Cesarean section N/A 11/02/2012    Procedure: CESAREAN SECTION-Baby boy  Apgar 9/9;  Surgeon: Michael Litter, MD;  Location: [facility];  Service: Obstetrics;  Laterality: N/A;    Family History  Problem Relation Age of Onset  . Hypertension Mother   . Bipolar disorder Mother   . Hypertension Maternal Aunt   . Bipolar disorder Maternal Aunt   . Schizophrenia Maternal Aunt   . OCD Maternal Aunt   . Hypertension Maternal Uncle   . Hypertension Maternal Grandmother     History  Substance Use Topics  . Smoking status: Former Games developer  . Smokeless tobacco: Never Used     Comment: black and mild  . Alcohol Use: No     Comment: occasional mixed drinks     Allergies:  Allergies  Allergen Reactions  . Shellfish-Derived Products Anaphylaxis and Swelling  . Sulfa Antibiotics Other (See Comments)    Reaction:  Unknown     Prescriptions prior to admission  Medication Sig Dispense Refill Last Dose   . ibuprofen (ADVIL,MOTRIN) 800 MG tablet Take 1 tablet (800 mg total) by mouth every 8 (eight) hours as needed. 30 tablet 0 09/09/2014 at 2200  . calcium carbonate (TUMS - DOSED IN MG ELEMENTAL CALCIUM) 500 MG chewable tablet Chew 1 tablet by mouth daily as needed for indigestion or heartburn.   09/06/2014 at Unknown time  . diphenhydrAMINE (BENADRYL) 25 mg capsule Take 25 mg by mouth every 6 (six) hours as needed for itching or allergies.    More than a month at PRN  . HYDROcodone-acetaminophen (NORCO/VICODIN) 5-325 MG per tablet Take 1-2 tablets by mouth every 6 (six) hours as needed. 20 tablet 0   . misoprostol (CYTOTEC) 200 MCG tablet Place 4 tablets (800 mcg total) vaginally once. 4 tablet 0     Review of Systems  Constitutional: Negative for fever and chills.  Respiratory: Negative for cough, shortness of breath and wheezing.   Cardiovascular: Negative for chest pain and palpitations.  Gastrointestinal: Positive for abdominal pain. Negative for heartburn, nausea, vomiting, diarrhea, constipation, blood in stool and melena.   Physical Exam   Temperature 98.1 F (36.7 C), temperature source Oral, resp. rate 20, height  (1.676 m), weight 207 lb (93.895 kg), last menstrual period 05/28/2014,  not currently breastfeeding.  Physical Exam  Constitutional: She is oriented to person, place, and time. She appears well-developed and well-nourished. No distress.  HENT:  Head: Normocephalic and atraumatic.  Right Ear: External ear normal.  Left Ear: External ear normal.  Eyes: EOM are normal. Pupils are equal, round, and reactive to light. No scleral icterus.  Neck: Normal range of motion. Neck supple.  Cardiovascular: Normal rate and regular rhythm.  Exam reveals no gallop and no friction rub.   No murmur heard. Respiratory: Effort normal and breath sounds normal. No respiratory distress. She has no wheezes. She has no rales. She exhibits no tenderness.  GI: Soft. Bowel sounds are normal.  She exhibits no distension and no mass. There is tenderness. There is no rebound (pelvic tenderness) and no guarding.  Genitourinary: There is no rash, tenderness, lesion or injury on the right labia. There is no rash, tenderness, lesion or injury on the left labia. There is bleeding (bleeding clots from cervix) in the vagina. No erythema or tenderness in the vagina. No foreign body around the vagina. No signs of injury around the vagina. No vaginal discharge found.  No products of conception extruding from os. GU exam done by Rochele PagesWalidah Karim, CNM.  Musculoskeletal: She exhibits no edema or tenderness.  Neurological: She is alert and oriented to person, place, and time.  Skin: Skin is warm and dry.  Psychiatric: She has a normal mood and affect. Her behavior is normal. Judgment and thought content normal.   Results for orders placed or performed during the hospital encounter of 09/09/14 (from the past 24 hour(s))  Prepare RBC     Status: None   Collection Time: 09/10/14 12:10 AM  Result Value Ref Range   Order Confirmation ORDER PROCESSED BY BLOOD BANK   Type and screen     Status: None (Preliminary result)   Collection Time: 09/10/14 12:30 AM  Result Value Ref Range   ABO/RH(D) A POS    Antibody Screen NEG    Sample Expiration 09/13/2014    Unit Number [id number]    Blood Component Type RED CELLS,LR    Unit division 00    Status of Unit ALLOCATED    Transfusion Status OK TO TRANSFUSE    Crossmatch Result Compatible    Unit Number [id number]    Blood Component Type RED CELLS,LR    Unit division 00    Status of Unit ALLOCATED    Transfusion Status OK TO TRANSFUSE    Crossmatch Result Compatible   CBC     Status: Abnormal   Collection Time: 09/10/14 12:30 AM  Result Value Ref Range   WBC 11.9 (H) 4.0 - 10.5 K/uL   RBC 4.77 3.87 - 5.11 MIL/uL   Hemoglobin 12.6 12.0 - 15.0 g/dL   HCT 30.837.6 65.736.0 - 84.646.0 %   MCV 78.8 78.0 - 100.0 fL   MCH 26.4 26.0 - 34.0 pg   MCHC 33.5 30.0 -  36.0 g/dL   RDW 96.214.9 95.211.5 - 84.115.5 %   Platelets 224 150 - 400 K/uL    MAU Course  Procedures  MDM With the amount of bleeding that the patient is having, I do not feel that it would be safe to get the patient Cytotec and send her home, particularly as the patient is having symptoms of dizziness and lightheadedness particularly when walking. I feel that she would likely become unstable and would return in a much worse condition.  Assessment and Plan  #1 missed AB #2  symptomatic bleeding  We'll obtain type and screen, CBC. Crossmatch for 2 units of red blood cells as precaution.  As the patient is having heavy bleeding with symptoms, will proceed with dilation and evacuation. Patient last ate at 4 PM and last drank 4 hours ago. Risks of D&E discussed with the patient including bleeding, infection, uterine perforation, risk of damage to adjacent organs. Consent signed and on chart. Doxycycline ordered as preoperative antibiotics. OR notified. To operating room when ready. STINSON, JACOB JEHIEL 09/10/2014, 12:21 AM

## 2014-09-12 ENCOUNTER — Encounter (HOSPITAL_COMMUNITY): Payer: Self-pay | Admitting: Family Medicine

## 2014-09-13 LAB — TYPE AND SCREEN
ABO/RH(D): A POS
ANTIBODY SCREEN: NEGATIVE
Unit division: 0
Unit division: 0

## 2014-09-15 ENCOUNTER — Telehealth: Payer: Self-pay | Admitting: *Deleted

## 2014-09-15 ENCOUNTER — Telehealth: Payer: Self-pay | Admitting: Family Medicine

## 2014-09-15 ENCOUNTER — Other Ambulatory Visit: Payer: Medicaid Other

## 2014-09-15 MED ORDER — TRAMADOL HCL 50 MG PO TABS: ORAL_TABLET | ORAL | Status: DC

## 2014-09-15 NOTE — Telephone Encounter (Signed)
Call returned to pt after consult with Dr. Macon LargeAnyanwu. I discussed pt's concerns and she stated that she needs more pain medication. She reports that she is taking the ibuprofen as prescribed and it does not help her pain. She stated that she does not want Vicodin as it makes her sick and the only medication that helps her is Percocet. She knows this because she had a previous C/S and that was the only medication which helped her pain. I explained that a C/S is major surgery and typically pts do not have as much pain with the surgical procedure which she had 5 days ago. Pt then stated, "You can't tell me that I am not having pain!". I told pt that is not what I am saying - just that she had a procedure which is considered a minor procedure. I assured her that I am sending a prescription to her pharmacy which was authorized by the doctor, however it is not Percocet. We advise that she take this medication as well as the ibuprofen. If she feels her pain is severe she can go to MAU for evaluation. Pt then stated, "Just as long as I have enough pain medication until my visit on the 21st".  I told pt that depending on how she ends up taking it, it may not last until the 21st. Hopefully she will not need medication much longer as she should be healing more each day. She may call back for further questions or problems. Pt also expressed frustration that no one has told her what was wrong with her baby and she wants some information. I explained that we may not know why she had a miscarriage and she can discuss further with the doctor at her follow up visit on 7/21. Pt stated that she has "no closure" and is angry that she has not been given information about her pregnancy loss. I told pt that I will send a message to Dr. Adrian BlackwaterStinson and ask him to call her. He will not be here again until Wednesday 7/13.  Pt expressed gratitude and voiced understanding of all information and instructions.

## 2014-09-15 NOTE — Telephone Encounter (Signed)
Received a voicemail message left on the nurse line on 09/15/14 at 0835.  States she is a patient of Dr. Adrian BlackwaterStinson and needs a refill on her pain medication.

## 2014-09-15 NOTE — Telephone Encounter (Signed)
patient called in this Morning to schedule her surgery follow up appt, she said she did not need the lab appt for today because she had surgery on September 09, 2014

## 2014-09-15 NOTE — Telephone Encounter (Signed)
Received voicemail message on nurse line left 09/12/14 at 1615.  Patient states she had surgery 2 days ago and needs a refill on her pain medication - Roxicet (oxycodone5/325).  Had D&C 09/10/14.

## 2014-09-25 ENCOUNTER — Ambulatory Visit (INDEPENDENT_AMBULATORY_CARE_PROVIDER_SITE_OTHER): Payer: Medicaid Other | Admitting: Family Medicine

## 2014-09-25 ENCOUNTER — Encounter: Payer: Self-pay | Admitting: Family Medicine

## 2014-09-25 VITALS — BP 134/85 | HR 75 | Temp 98.1°F | Ht 64.0 in | Wt 205.6 lb

## 2014-09-25 DIAGNOSIS — O021 Missed abortion: Secondary | ICD-10-CM

## 2014-09-25 NOTE — Progress Notes (Signed)
   Subjective:    Patient ID: Christina Olsen, female    DOB: 18-Sep-1990, 24 y.o.   MRN: [id number]  HPI Patient seen 2 weeks following a D&E for missed abortion with symptomatic bleeding.  She had a couple days of bleeding after the procedure and is now fine.  No cramping, bleeding, fevers, chills.   Review of Systems     Objective:   Physical Exam  Constitutional: She is oriented to person, place, and time. She appears well-developed and well-nourished.  Pulmonary/Chest: Effort normal and breath sounds normal. No respiratory distress. She has no wheezes. She has no rales. She exhibits no tenderness.  Abdominal: Soft. Bowel sounds are normal. She exhibits no distension and no mass. There is no tenderness. There is no rebound and no guarding.  Neurological: She is alert and oriented to person, place, and time.  Skin: Skin is warm and dry.  Psychiatric: She has a normal mood and affect. Her behavior is normal. Judgment and thought content normal.      Assessment & Plan:  1. Missed abortion Discussed that she should wait approximately 6 months prior to becoming pregnant. Discussed birth control options, however the patient desires to use condoms. Check hCG in 6-8 weeks if she does not have menses in that time.

## 2014-09-25 NOTE — Progress Notes (Signed)
Pt reports pain sometimes in her abdomen

## 2015-01-07 ENCOUNTER — Encounter (HOSPITAL_COMMUNITY): Payer: Self-pay | Admitting: *Deleted

## 2015-01-07 ENCOUNTER — Inpatient Hospital Stay (HOSPITAL_COMMUNITY)
Admission: AD | Admit: 2015-01-07 | Discharge: 2015-01-07 | Disposition: A | Discharge status: Home / Self Care | Payer: Medicaid Other | Source: Ambulatory Visit | Attending: Obstetrics and Gynecology | Admitting: Obstetrics and Gynecology

## 2015-01-07 DIAGNOSIS — Z3A01 Less than 8 weeks gestation of pregnancy: Secondary | ICD-10-CM | POA: Insufficient documentation

## 2015-01-07 DIAGNOSIS — O210 Mild hyperemesis gravidarum: Secondary | ICD-10-CM | POA: Diagnosis present

## 2015-01-07 DIAGNOSIS — O219 Vomiting of pregnancy, unspecified: Secondary | ICD-10-CM

## 2015-01-07 DIAGNOSIS — Z87891 Personal history of nicotine dependence: Secondary | ICD-10-CM | POA: Diagnosis not present

## 2015-01-07 LAB — POCT PREGNANCY, URINE: Preg Test, Ur: POSITIVE — AB

## 2015-01-07 LAB — COMPREHENSIVE METABOLIC PANEL
ALT: 25 U/L (ref 14–54)
AST: 22 U/L (ref 15–41)
Albumin: 4.7 g/dL (ref 3.5–5.0)
Alkaline Phosphatase: 73 U/L (ref 38–126)
Anion gap: 9 (ref 5–15)
BUN: 9 mg/dL (ref 6–20)
CALCIUM: 9.3 mg/dL (ref 8.9–10.3)
CHLORIDE: 105 mmol/L (ref 101–111)
CO2: 19 mmol/L — ABNORMAL LOW (ref 22–32)
Creatinine, Ser: 0.7 mg/dL (ref 0.44–1.00)
GFR calc Af Amer: >60 mL/min (ref >60)
GLUCOSE: 81 mg/dL (ref 65–99)
Potassium: 3.7 mmol/L (ref 3.5–5.1)
Sodium: 133 mmol/L — ABNORMAL LOW (ref 135–145)
TOTAL PROTEIN: 8 g/dL (ref 6.5–8.1)
Total Bilirubin: 0.5 mg/dL (ref 0.3–1.2)

## 2015-01-07 LAB — CBC
HCT: 42.8 % (ref 36.0–46.0)
Hemoglobin: 15.1 g/dL — ABNORMAL HIGH (ref 12.0–15.0)
MCH: 26.7 pg (ref 26.0–34.0)
MCHC: 35.3 g/dL (ref 30.0–36.0)
MCV: 75.8 fL — AB (ref 78.0–100.0)
PLATELETS: 200 10*3/uL (ref 150–400)
RBC: 5.65 MIL/uL — AB (ref 3.87–5.11)
RDW: 15 % (ref 11.5–15.5)
WBC: 12.1 10*3/uL — AB (ref 4.0–10.5)

## 2015-01-07 LAB — URINALYSIS, ROUTINE W REFLEX MICROSCOPIC
Bilirubin Urine: NEGATIVE
GLUCOSE, UA: NEGATIVE mg/dL
Ketones, ur: 15 mg/dL — AB
Nitrite: NEGATIVE
Protein, ur: 30 mg/dL — AB
Urobilinogen, UA: 1 mg/dL (ref 0.0–1.0)
pH: 6 (ref 5.0–8.0)

## 2015-01-07 LAB — URINE MICROSCOPIC-ADD ON

## 2015-01-07 MED ORDER — METOCLOPRAMIDE HCL 5 MG/ML IJ SOLN
10.0000 mg | Freq: Once | INTRAMUSCULAR | Status: AC
  Administered 2015-01-07: 10 mg via INTRAVENOUS
  Filled 2015-01-07: qty 2

## 2015-01-07 MED ORDER — PROMETHAZINE HCL 25 MG PO TABS: 25.0000 mg | ORAL_TABLET | Freq: Four times a day (QID) | ORAL | Status: DC | PRN

## 2015-01-07 MED ORDER — LACTATED RINGERS IV BOLUS (SEPSIS)
1000.0000 mL | Freq: Once | INTRAVENOUS | Status: AC
Start: 2015-01-07 — End: 2015-01-07
  Administered 2015-01-07: 1000 mL via INTRAVENOUS

## 2015-01-07 MED ORDER — METOCLOPRAMIDE HCL 10 MG PO TABS: 10.0000 mg | ORAL_TABLET | Freq: Four times a day (QID) | ORAL | Status: DC

## 2015-01-07 NOTE — Progress Notes (Signed)
Went into start IV and give medication. Patient asked that RN come back later, "Give me a minute." Advised to press call light when she is ready.

## 2015-01-07 NOTE — Progress Notes (Signed)
Patient never looked up from cell phone during interview and history. Patient received calls and proceeded to talk on phone during interview.

## 2015-01-07 NOTE — Discharge Instructions (Signed)
Morning Sickness °Morning sickness is when you feel sick to your stomach (nauseous) during pregnancy. This nauseous feeling may or may not come with vomiting. It often occurs in the morning but can be a problem any time of day. Morning sickness is most common during the first trimester, but it may continue throughout pregnancy. While morning sickness is unpleasant, it is usually harmless unless you develop severe and continual vomiting (hyperemesis gravidarum). This condition requires more intense treatment.  °CAUSES  °The cause of morning sickness is not completely known but seems to be related to normal hormonal changes that occur in pregnancy. °RISK FACTORS °You are at greater risk if you: °· Experienced nausea or vomiting before your pregnancy. °· Had morning sickness during a previous pregnancy. °· Are pregnant with more than one baby, such as twins. °TREATMENT  °Do not use any medicines (prescription, over-the-counter, or herbal) for morning sickness without first talking to your health care provider. Your health care provider may prescribe or recommend: °· Vitamin B6 supplements. °· Anti-nausea medicines. °· The herbal medicine ginger. °HOME CARE INSTRUCTIONS  °· Only take over-the-counter or prescription medicines as directed by your health care provider. °· Taking multivitamins before getting pregnant can prevent or decrease the severity of morning sickness in most women. °· Eat a piece of dry toast or unsalted crackers before getting out of bed in the morning. °· Eat five or six small meals a day. °· Eat dry and bland foods (rice, baked potato). Foods high in carbohydrates are often helpful. °· Do not drink liquids with your meals. Drink liquids between meals. °· Avoid greasy, fatty, and spicy foods. °· Get someone to cook for you if the smell of any food causes nausea and vomiting. °· If you feel nauseous after taking prenatal vitamins, take the vitamins at night or with a snack.  °· Snack on protein  foods (nuts, yogurt, cheese) between meals if you are hungry. °· Eat unsweetened gelatins for desserts. °· Wearing an acupressure wristband (worn for sea sickness) may be helpful. °· Acupuncture may be helpful. °· Do not smoke. °· Get a humidifier to keep the air in your house free of odors. °· Get plenty of fresh air. °SEEK MEDICAL CARE IF:  °· Your home remedies are not working, and you need medicine. °· You feel dizzy or lightheaded. °· You are losing weight. °SEEK IMMEDIATE MEDICAL CARE IF:  °· You have persistent and uncontrolled nausea and vomiting. °· You pass out (faint). °MAKE SURE YOU: °· Understand these instructions. °· Will watch your condition. °· Will get help right away if you are not doing well or get worse. °  °This information is not intended to replace advice given to you by your health care provider. Make sure you discuss any questions you have with your health care provider. °  °Document Released: 04/14/2006 Document Revised: 02/26/2013 Document Reviewed: 08/08/2012 °Elsevier Interactive Patient Education ©2016 [employer] ° ° ° °Eating Plan for Hyperemesis Gravidarum °Severe cases of hyperemesis gravidarum can lead to dehydration and malnutrition. The hyperemesis eating plan is one way to lessen the symptoms of nausea and vomiting. It is often used with prescribed medicines to control your symptoms.  °WHAT CAN I DO TO RELIEVE MY SYMPTOMS? °Listen to your body. Everyone is different and has different preferences. Find what works best for you. Some of the following things may help: °· Eat and drink slowly. °· Eat 5-6 small meals daily instead of 3 large meals.   °· Eat crackers before you   get out of bed in the morning.   °· Starchy foods are usually well tolerated (such as cereal, toast, bread, potatoes, pasta, rice, and pretzels).   °· Ginger may help with nausea. Add ¼ tsp ground ginger to hot tea or choose ginger tea.   °· Try drinking 100% fruit juice or an electrolyte drink. °· Continue  to take your prenatal vitamins as directed by your health care provider. If you are having trouble taking your prenatal vitamins, talk with your health care provider about different options. °· Include at least 1 serving of protein with your meals and snacks (such as meats or poultry, beans, nuts, eggs, or yogurt). Try eating a protein-rich snack before bed (such as cheese and crackers or a half turkey or peanut butter sandwich). °WHAT THINGS SHOULD I AVOID TO REDUCE MY SYMPTOMS? °The following things may help reduce your symptoms: °· Avoid foods with strong smells. Try eating meals in well-ventilated areas that are free of odors. °· Avoid drinking water or other beverages with meals. Try not to drink anything less than 30 minutes before and after meals. °· Avoid drinking more than 1 cup of fluid at a time. °· Avoid fried or high-fat foods, such as butter and cream sauces. °· Avoid spicy foods. °· Avoid skipping meals the best you can. Nausea can be more intense on an empty stomach. If you cannot tolerate food at that time, do not force it. Try sucking on ice chips or other frozen items and make up the calories later. °· Avoid lying down within 2 hours after eating. °  °This information is not intended to replace advice given to you by your health care provider. Make sure you discuss any questions you have with your health care provider. °  °Document Released: 12/19/2006 Document Revised: 02/26/2013 Document Reviewed: 12/26/2012 °Elsevier Interactive Patient Education ©2016 [employer] ° °

## 2015-01-07 NOTE — MAU Note (Signed)
Pt presents to MAU with complaints of nausea and vomiting since Saturday. + pregnancy test on Thursday. Denies any vaginal bleeding or abnormal discharge

## 2015-01-07 NOTE — MAU Provider Note (Signed)
History     CSN: [id number]  Arrival date and time: 01/07/15 1438   First Provider Initiated Contact with Patient 01/07/15 1628      Chief Complaint  Patient presents with  . Nausea  . Emesis   HPI Christina Olsen is a 24 y.o. [phone] at 1084w0d who presents with nausea & vomiting.  Reports nausea & vomiting since Saturday. States vomits 3 times per day. One episode of diarrhea today.  Denies abdominal pain or vaginal bleeding.  [facility]  HPI difficult to obtain d/t patient being on her phone.     OB History    Gravida Para Term Preterm AB TAB SAB Ectopic Multiple Living   5 1 1  3  3   1       Past Medical History  Diagnosis Date  . Chlamydia   . Trichimoniasis   . Urinary tract infection     Past Surgical History  Procedure Laterality Date  . Wisdom tooth extraction    . Fracture surgery  1999    right collarbone  . Cesarean section N/A 11/02/2012    Procedure: CESAREAN SECTION-Baby boy @2327  Apgar 9/9;  Surgeon: Michael LitterNaima A Dillard, MD;  Location: [facility];  Service: Obstetrics;  Laterality: N/A;  . Dilation and evacuation N/A 09/10/2014    Procedure: DILATATION AND EVACUATION;  Surgeon: Levie HeritageJacob J Stinson, DO;  Location: [facility];  Service: Gynecology;  Laterality: N/A;    Family History  Problem Relation Age of Onset  . Hypertension Mother   . Bipolar disorder Mother   . Hypertension Maternal Aunt   . Bipolar disorder Maternal Aunt   . Schizophrenia Maternal Aunt   . OCD Maternal Aunt   . Hypertension Maternal Uncle   . Hypertension Maternal Grandmother     Social History  Substance Use Topics  . Smoking status: Former Games developermoker  . Smokeless tobacco: Never Used     Comment: black and mild  . Alcohol Use: No     Comment: occasional mixed drinks     Allergies:  Allergies  Allergen Reactions  . Shellfish-Derived Products Anaphylaxis and Swelling  . Sulfa Antibiotics Other (See Comments)   Reaction:  Unknown     Prescriptions prior to admission  Medication Sig Dispense Refill Last Dose  . calcium carbonate (TUMS - DOSED IN MG ELEMENTAL CALCIUM) 500 MG chewable tablet Chew 1 tablet by mouth daily as needed for indigestion or heartburn.   Not Taking  . diphenhydrAMINE (BENADRYL) 25 mg capsule Take 25 mg by mouth every 6 (six) hours as needed for itching or allergies.    Not Taking  . ibuprofen (ADVIL,MOTRIN) 800 MG tablet Take 1 tablet (800 mg total) by mouth every 8 (eight) hours as needed. 30 tablet 0 Taking  . oxyCODONE-acetaminophen (ROXICET) 5-325 MG per tablet Take 1-2 tablets by mouth every 4 (four) hours as needed for severe pain. (Patient not taking: Reported on 09/25/2014) 12 tablet 0 Not Taking  . traMADol (ULTRAM) 50 MG tablet Take 1-2 tablets by mouth every 6 hrs as needed for moderate to severe pain. (Patient not taking: Reported on 09/25/2014) 20 tablet 0 Not Taking    Review of Systems  Constitutional: Negative.   Gastrointestinal: Positive for nausea, vomiting and diarrhea. Negative for abdominal pain and constipation.  Genitourinary: Negative.    Physical Exam   Blood pressure 125/78, pulse 85, temperature 98.2 F (36.8 C), resp. rate 18, height 5\' 5"  (1.651 m),  weight 204 lb (92.534 kg), last menstrual period 11/26/2014, unknown if currently breastfeeding.  Physical Exam  Nursing note and vitals reviewed. Constitutional: She is oriented to person, place, and time. She appears well-developed and well-nourished. No distress.  HENT:  Head: Normocephalic and atraumatic.  Eyes: Conjunctivae are normal. Right eye exhibits no discharge. Left eye exhibits no discharge. No scleral icterus.  Neck: Normal range of motion.  Cardiovascular: Normal rate, regular rhythm and normal heart sounds.   No murmur heard. Respiratory: Effort normal and breath sounds normal. No respiratory distress. She has no wheezes.  GI: Soft. Bowel sounds are normal. She exhibits no  distension. There is no tenderness.  Neurological: She is alert and oriented to person, place, and time.  Skin: Skin is warm and dry. She is not diaphoretic.  Psychiatric: She has a normal mood and affect. Her behavior is normal. Judgment and thought content normal.    MAU Course  Procedures Results for orders placed or performed during the hospital encounter of 01/07/15 (from the past 24 hour(s))  Urinalysis, Routine w reflex microscopic (not at [facility]     Status: Abnormal   Collection Time: 01/07/15  2:50 PM  Result Value Ref Range   Color, Urine YELLOW YELLOW   APPearance CLOUDY (A) CLEAR   Specific Gravity, Urine >1.030 (H) 1.005 - 1.030   pH 6.0 5.0 - 8.0   Glucose, UA NEGATIVE NEGATIVE mg/dL   Hgb urine dipstick TRACE (A) NEGATIVE   Bilirubin Urine NEGATIVE NEGATIVE   Ketones, ur 15 (A) NEGATIVE mg/dL   Protein, ur 30 (A) NEGATIVE mg/dL   Urobilinogen, UA 1.0 0.0 - 1.0 mg/dL   Nitrite NEGATIVE NEGATIVE   Leukocytes, UA SMALL (A) NEGATIVE  Urine microscopic-add on     Status: Abnormal   Collection Time: 01/07/15  2:50 PM  Result Value Ref Range   Squamous Epithelial / LPF FEW (A) RARE   WBC, UA 0-2 <3 WBC/hpf   RBC / HPF 0-2 <3 RBC/hpf   Bacteria, UA FEW (A) RARE  Pregnancy, urine POC     Status: Abnormal   Collection Time: 01/07/15  3:13 PM  Result Value Ref Range   Preg Test, Ur POSITIVE (A) NEGATIVE  CBC     Status: Abnormal   Collection Time: 01/07/15  5:15 PM  Result Value Ref Range   WBC 12.1 (H) 4.0 - 10.5 K/uL   RBC 5.65 (H) 3.87 - 5.11 MIL/uL   Hemoglobin 15.1 (H) 12.0 - 15.0 g/dL   HCT 16.1 09.6 - 04.5 %   MCV 75.8 (L) 78.0 - 100.0 fL   MCH 26.7 26.0 - 34.0 pg   MCHC 35.3 30.0 - 36.0 g/dL   RDW 40.9 81.1 - 91.4 %   Platelets 200 150 - 400 K/uL  Comprehensive metabolic panel     Status: Abnormal   Collection Time: 01/07/15  5:15 PM  Result Value Ref Range   Sodium 133 (L) 135 - 145 mmol/L   Potassium 3.7 3.5 - 5.1 mmol/L   Chloride 105 101 - 111 mmol/L    CO2 19 (L) 22 - 32 mmol/L   Glucose, Bld 81 65 - 99 mg/dL   BUN 9 6 - 20 mg/dL   Creatinine, Ser 7.82 0.44 - 1.00 mg/dL   Calcium 9.3 8.9 - 95.6 mg/dL   Total Protein 8.0 6.5 - 8.1 g/dL   Albumin 4.7 3.5 - 5.0 g/dL   AST 22 15 - 41 U/L   ALT 25 14 - 54  U/L   Alkaline Phosphatase 73 38 - 126 U/L   Total Bilirubin 0.5 0.3 - 1.2 mg/dL   GFR calc non Af Amer >60 >60 mL/min   GFR calc Af Amer >60 >60 mL/min   Anion gap 9 5 - 15    MDM UPT positive A positive CBC & CMP IV fluid bolus of LR & reglan Pt reports improvement in symptoms Assessment and Plan  A:  1. Vomiting affecting pregnancy    P: Discharge home Rx reglan & phenergan Discussed reasons to return to MAU N/v diet education given Keep schedule ob appt  Judeth Horn, NP  01/07/2015, 4:26 PM

## 2015-02-02 ENCOUNTER — Other Ambulatory Visit: Payer: Self-pay | Admitting: Obstetrics and Gynecology

## 2015-02-02 LAB — OB RESULTS CONSOLE ABO/RH: RH Type: POSITIVE

## 2015-02-02 LAB — OB RESULTS CONSOLE GC/CHLAMYDIA
CHLAMYDIA, DNA PROBE: NEGATIVE
GC PROBE AMP, GENITAL: NEGATIVE

## 2015-02-02 LAB — OB RESULTS CONSOLE HIV ANTIBODY (ROUTINE TESTING): HIV: NONREACTIVE

## 2015-02-02 LAB — OB RESULTS CONSOLE RPR: RPR: NONREACTIVE

## 2015-02-02 LAB — OB RESULTS CONSOLE RUBELLA ANTIBODY, IGM: Rubella: IMMUNE

## 2015-02-02 LAB — OB RESULTS CONSOLE HEPATITIS B SURFACE ANTIGEN: HEP B S AG: NEGATIVE

## 2015-03-08 NOTE — L&D Delivery Note (Signed)
Pt is a 25 y/o black female, G5P1031 at term who [facility]. She had a h.o. Prior C/S and desired to attempt a TOLAC. PNC was uncomplicated. She was 3cm on admission. She had an amniotomy with clear fluid. She slowly progressed to 5cm and pit aug was started. She then rapidly completed the first stage. She pushed for 30 min and then had a precipitous delivery of one live viable black female infant over a second degree midline tear. Placenta-S/I. EBL-400cc. Baby to NBN. Tear closed with 3-0 chromic and 2-0 vicryl.

## 2015-03-22 ENCOUNTER — Inpatient Hospital Stay (HOSPITAL_COMMUNITY)
Admission: AD | Admit: 2015-03-22 | Discharge: 2015-03-22 | Disposition: A | Discharge status: Home / Self Care | Payer: Medicaid Other | Source: Ambulatory Visit | Attending: Obstetrics and Gynecology | Admitting: Obstetrics and Gynecology

## 2015-03-22 ENCOUNTER — Encounter (HOSPITAL_COMMUNITY): Payer: Self-pay | Admitting: *Deleted

## 2015-03-22 DIAGNOSIS — R55 Syncope and collapse: Secondary | ICD-10-CM | POA: Insufficient documentation

## 2015-03-22 DIAGNOSIS — Z87891 Personal history of nicotine dependence: Secondary | ICD-10-CM | POA: Diagnosis not present

## 2015-03-22 DIAGNOSIS — O26892 Other specified pregnancy related conditions, second trimester: Secondary | ICD-10-CM | POA: Diagnosis not present

## 2015-03-22 DIAGNOSIS — Z3A16 16 weeks gestation of pregnancy: Secondary | ICD-10-CM | POA: Insufficient documentation

## 2015-03-22 DIAGNOSIS — O210 Mild hyperemesis gravidarum: Secondary | ICD-10-CM | POA: Insufficient documentation

## 2015-03-22 DIAGNOSIS — O219 Vomiting of pregnancy, unspecified: Secondary | ICD-10-CM | POA: Diagnosis not present

## 2015-03-22 LAB — COMPREHENSIVE METABOLIC PANEL
ALT: 14 U/L (ref 14–54)
ANION GAP: 8 (ref 5–15)
AST: 16 U/L (ref 15–41)
Albumin: 3.5 g/dL (ref 3.5–5.0)
Alkaline Phosphatase: 58 U/L (ref 38–126)
BILIRUBIN TOTAL: 0.5 mg/dL (ref 0.3–1.2)
BUN: 10 mg/dL (ref 6–20)
CHLORIDE: 103 mmol/L (ref 101–111)
CO2: 24 mmol/L (ref 22–32)
Calcium: 9.1 mg/dL (ref 8.9–10.3)
Creatinine, Ser: 0.58 mg/dL (ref 0.44–1.00)
GFR calc Af Amer: >60 mL/min (ref >60)
GFR calc non Af Amer: >60 mL/min (ref >60)
Glucose, Bld: 98 mg/dL (ref 65–99)
POTASSIUM: 3.6 mmol/L (ref 3.5–5.1)
Sodium: 135 mmol/L (ref 135–145)
TOTAL PROTEIN: 6.8 g/dL (ref 6.5–8.1)

## 2015-03-22 LAB — URINALYSIS, ROUTINE W REFLEX MICROSCOPIC
BILIRUBIN URINE: NEGATIVE
GLUCOSE, UA: NEGATIVE mg/dL
HGB URINE DIPSTICK: NEGATIVE
Ketones, ur: 15 mg/dL — AB
Leukocytes, UA: NEGATIVE
Nitrite: NEGATIVE
PROTEIN: 30 mg/dL — AB
SPECIFIC GRAVITY, URINE: 1.025 (ref 1.005–1.030)
pH: 6 (ref 5.0–8.0)

## 2015-03-22 LAB — URINE MICROSCOPIC-ADD ON

## 2015-03-22 LAB — CBC
HEMATOCRIT: 35.1 % — AB (ref 36.0–46.0)
Hemoglobin: 12 g/dL (ref 12.0–15.0)
MCH: 26.6 pg (ref 26.0–34.0)
MCHC: 34.2 g/dL (ref 30.0–36.0)
MCV: 77.8 fL — AB (ref 78.0–100.0)
PLATELETS: 192 10*3/uL (ref 150–400)
RBC: 4.51 MIL/uL (ref 3.87–5.11)
RDW: 14.9 % (ref 11.5–15.5)
WBC: 11.7 10*3/uL — ABNORMAL HIGH (ref 4.0–10.5)

## 2015-03-22 MED ORDER — ONDANSETRON 8 MG PO TBDP
8.0000 mg | ORAL_TABLET | Freq: Once | ORAL | Status: DC
Start: 2015-03-22 — End: 2015-03-22
  Filled 2015-03-22: qty 1

## 2015-03-22 NOTE — MAU Note (Signed)
Fainted at work around 2:00 pm did not loose consciousness, dizziness, blurred vision, lower abdominal pain last night, none today, yesterday had vomiting but none today.

## 2015-03-22 NOTE — Discharge Instructions (Signed)

## 2015-03-22 NOTE — MAU Provider Note (Signed)
History     CSN: [id number]  Arrival date and time: 03/22/15 4098   First Provider Initiated Contact with Patient 03/22/15 2025         Chief Complaint  Patient presents with  . Near Syncope  . Abdominal Pain   HPI  Christina Olsen is a 25 y.o. [phone] at 100w5d who presents s/p syncopal episode. Episode occurred while she was at work today at 2 pm. States she didn't lose conciousness. Was dizzy prior to episode. Denies chest pain, SOB, or palpitations. Some nausea/vomiting that has occurred throughout pregnancy. States she was walking when this occurred. Had eaten some spaghetti 2 hours prior. Did not hit any body parts or fall to ground.   OB History    Gravida Para Term Preterm AB TAB SAB Ectopic Multiple Living   5 1 1  3  3   1       Past Medical History  Diagnosis Date  . Chlamydia   . Trichimoniasis   . Urinary tract infection     Past Surgical History  Procedure Laterality Date  . Wisdom tooth extraction    . Fracture surgery  1999    right collarbone  . Cesarean section N/A 11/02/2012    Procedure: CESAREAN SECTION-Baby boy @2327  Apgar 9/9;  Surgeon: Michael Litter, MD;  Location: [facility];  Service: Obstetrics;  Laterality: N/A;  . Dilation and evacuation N/A 09/10/2014    Procedure: DILATATION AND EVACUATION;  Surgeon: Levie Heritage, DO;  Location: [facility];  Service: Gynecology;  Laterality: N/A;    Family History  Problem Relation Age of Onset  . Hypertension Mother   . Bipolar disorder Mother   . Hypertension Maternal Aunt   . Bipolar disorder Maternal Aunt   . Schizophrenia Maternal Aunt   . OCD Maternal Aunt   . Hypertension Maternal Uncle   . Hypertension Maternal Grandmother     Social History  Substance Use Topics  . Smoking status: Former Games developer  . Smokeless tobacco: Never Used     Comment: black and mild  . Alcohol Use: No     Comment: occasional mixed drinks     Allergies:  Allergies  Allergen Reactions  . Shellfish-Derived  Products Anaphylaxis and Swelling  . Sulfa Antibiotics Other (See Comments)    Reaction:  Unknown, childhood rxn.     Prescriptions prior to admission  Medication Sig Dispense Refill Last Dose  . diphenhydrAMINE (BENADRYL) 25 mg capsule Take 25 mg by mouth every 6 (six) hours as needed for itching or allergies.    Past Month at Unknown time  . metoCLOPramide (REGLAN) 10 MG tablet Take 1 tablet (10 mg total) by mouth every 6 (six) hours. 30 tablet 0 Past Week at Unknown time  . promethazine (PHENERGAN) 25 MG tablet Take 1 tablet (25 mg total) by mouth every 6 (six) hours as needed for nausea or vomiting. 30 tablet 0 Past Week at Unknown time    Review of Systems  Constitutional: Negative.   Respiratory: Negative.   Cardiovascular: Negative.   Gastrointestinal: Positive for nausea and vomiting. Negative for abdominal pain.  Genitourinary: Negative.   Neurological: Positive for dizziness. Negative for loss of consciousness and headaches.   Physical Exam   Blood pressure 136/75, pulse 91, temperature 98.1 F (36.7 C), temperature source Oral, resp. rate 18, height 5\' 5"  (1.651 m), weight 208 lb 3.2 oz (94.439 kg), last menstrual period 11/26/2014, unknown if currently breastfeeding.  Orthostatic VS for the past 24  hrs:  BP- Lying Pulse- Lying BP- Sitting Pulse- Sitting  03/22/15 2000 114/63 mmHg 82 125/73 mmHg 83       Physical Exam  Nursing note and vitals reviewed. Constitutional: She is oriented to person, place, and time. She appears well-developed and well-nourished. No distress.  HENT:  Head: Normocephalic and atraumatic.  Eyes: Conjunctivae are normal. Pupils are equal, round, and reactive to light. Right eye exhibits no discharge. Left eye exhibits no discharge. No scleral icterus.  Neck: Normal range of motion.  Cardiovascular: Normal rate, regular rhythm and normal heart sounds.   No murmur heard. Respiratory: Effort normal and breath sounds normal. No respiratory  distress. She has no wheezes.  Neurological: She is alert and oriented to person, place, and time.  Skin: Skin is warm and dry. She is not diaphoretic.  Psychiatric: She has a normal mood and affect. Her behavior is normal. Judgment and thought content normal.    MAU Course  Procedures Results for orders placed or performed during the hospital encounter of 03/22/15 (from the past 24 hour(s))  Urinalysis, Routine w reflex microscopic (not at [facility])     Status: Abnormal   Collection Time: 03/22/15  6:40 PM  Result Value Ref Range   Color, Urine YELLOW YELLOW   APPearance CLEAR CLEAR   Specific Gravity, Urine 1.025 1.005 - 1.030   pH 6.0 5.0 - 8.0   Glucose, UA NEGATIVE NEGATIVE mg/dL   Hgb urine dipstick NEGATIVE NEGATIVE   Bilirubin Urine NEGATIVE NEGATIVE   Ketones, ur 15 (A) NEGATIVE mg/dL   Protein, ur 30 (A) NEGATIVE mg/dL   Nitrite NEGATIVE NEGATIVE   Leukocytes, UA NEGATIVE NEGATIVE  Urine microscopic-add on     Status: Abnormal   Collection Time: 03/22/15  6:40 PM  Result Value Ref Range   Squamous Epithelial / LPF 0-5 (A) NONE SEEN   WBC, UA 0-5 0 - 5 WBC/hpf   RBC / HPF 0-5 0 - 5 RBC/hpf   Bacteria, UA FEW (A) NONE SEEN  CBC     Status: Abnormal   Collection Time: 03/22/15  7:05 PM  Result Value Ref Range   WBC 11.7 (H) 4.0 - 10.5 K/uL   RBC 4.51 3.87 - 5.11 MIL/uL   Hemoglobin 12.0 12.0 - 15.0 g/dL   HCT 16.1 (L) 09.6 - 04.5 %   MCV 77.8 (L) 78.0 - 100.0 fL   MCH 26.6 26.0 - 34.0 pg   MCHC 34.2 30.0 - 36.0 g/dL   RDW 40.9 81.1 - 91.4 %   Platelets 192 150 - 400 K/uL  Comprehensive metabolic panel     Status: None   Collection Time: 03/22/15  7:05 PM  Result Value Ref Range   Sodium 135 135 - 145 mmol/L   Potassium 3.6 3.5 - 5.1 mmol/L   Chloride 103 101 - 111 mmol/L   CO2 24 22 - 32 mmol/L   Glucose, Bld 98 65 - 99 mg/dL   BUN 10 6 - 20 mg/dL   Creatinine, Ser 7.82 0.44 - 1.00 mg/dL   Calcium 9.1 8.9 - 95.6 mg/dL   Total Protein 6.8 6.5 - 8.1 g/dL    Albumin 3.5 3.5 - 5.0 g/dL   AST 16 15 - 41 U/L   ALT 14 14 - 54 U/L   Alkaline Phosphatase 58 38 - 126 U/L   Total Bilirubin 0.5 0.3 - 1.2 mg/dL   GFR calc non Af Amer >60 >60 mL/min   GFR calc Af Amer >60 >60  mL/min   Anion gap 8 5 - 15    MDM Pt asymptomatic.  Orthostatic VS normal Labs & EKG normal FHT 150 by doppler 2141- S/w Dr. Dareen PianoAnderson. Ok to discharge home  Assessment and Plan  A:  1. Syncope, unspecified syncope type   2. Nausea/vomiting in pregnancy    P: Discharge home Increase water intake Slow position changes Eat several small meals throughout day Keep f/u with ob  Judeth HornErin Shadrach Bartunek, NP  03/22/2015, 8:24 PM

## 2015-04-14 ENCOUNTER — Encounter (HOSPITAL_COMMUNITY): Payer: Self-pay | Admitting: *Deleted

## 2015-04-14 ENCOUNTER — Inpatient Hospital Stay (HOSPITAL_COMMUNITY)
Admission: AD | Admit: 2015-04-14 | Discharge: 2015-04-14 | Disposition: A | Discharge status: Home / Self Care | Payer: Medicaid Other | Source: Ambulatory Visit | Attending: Family Medicine | Admitting: Family Medicine

## 2015-04-14 DIAGNOSIS — Z3A20 20 weeks gestation of pregnancy: Secondary | ICD-10-CM | POA: Diagnosis not present

## 2015-04-14 DIAGNOSIS — O9989 Other specified diseases and conditions complicating pregnancy, childbirth and the puerperium: Secondary | ICD-10-CM | POA: Diagnosis not present

## 2015-04-14 DIAGNOSIS — Z87891 Personal history of nicotine dependence: Secondary | ICD-10-CM | POA: Insufficient documentation

## 2015-04-14 DIAGNOSIS — R1084 Generalized abdominal pain: Secondary | ICD-10-CM | POA: Insufficient documentation

## 2015-04-14 DIAGNOSIS — Z3A10 10 weeks gestation of pregnancy: Secondary | ICD-10-CM | POA: Insufficient documentation

## 2015-04-14 DIAGNOSIS — Z91013 Allergy to seafood: Secondary | ICD-10-CM | POA: Insufficient documentation

## 2015-04-14 DIAGNOSIS — J069 Acute upper respiratory infection, unspecified: Secondary | ICD-10-CM | POA: Diagnosis not present

## 2015-04-14 DIAGNOSIS — R509 Fever, unspecified: Secondary | ICD-10-CM | POA: Insufficient documentation

## 2015-04-14 DIAGNOSIS — Z882 Allergy status to sulfonamides status: Secondary | ICD-10-CM | POA: Insufficient documentation

## 2015-04-14 DIAGNOSIS — R102 Pelvic and perineal pain: Secondary | ICD-10-CM | POA: Insufficient documentation

## 2015-04-14 DIAGNOSIS — J029 Acute pharyngitis, unspecified: Secondary | ICD-10-CM | POA: Diagnosis not present

## 2015-04-14 DIAGNOSIS — R51 Headache: Secondary | ICD-10-CM | POA: Diagnosis not present

## 2015-04-14 LAB — URINE MICROSCOPIC-ADD ON

## 2015-04-14 LAB — URINALYSIS, ROUTINE W REFLEX MICROSCOPIC
Bilirubin Urine: NEGATIVE
Glucose, UA: NEGATIVE mg/dL
Hgb urine dipstick: NEGATIVE
Ketones, ur: NEGATIVE mg/dL
Nitrite: NEGATIVE
Protein, ur: 30 mg/dL — AB
Specific Gravity, Urine: >1.03 — ABNORMAL HIGH (ref 1.005–1.030)
pH: 6 (ref 5.0–8.0)

## 2015-04-14 LAB — INFLUENZA PANEL BY PCR (TYPE A & B)
H1N1 flu by pcr: NOT DETECTED
Influenza A By PCR: NEGATIVE
Influenza B By PCR: NEGATIVE

## 2015-04-14 MED ORDER — AZITHROMYCIN 250 MG PO TABS: ORAL_TABLET | ORAL | Status: AC

## 2015-04-14 MED ORDER — BENZONATATE 100 MG PO CAPS: 100.0000 mg | ORAL_CAPSULE | Freq: Three times a day (TID) | ORAL | Status: DC | PRN

## 2015-04-14 NOTE — MAU Note (Signed)
Excessive coughing since yesterday, productive. Throat feels raw.  Fever 101.  Pressure in head.  Pelvic pressure.

## 2015-04-14 NOTE — Discharge Instructions (Signed)
Cough, Adult °A cough helps to clear your throat and lungs. A cough may last only 2-3 weeks (acute), or it may last longer than 8 weeks (chronic). Many different things can cause a cough. A cough may be a sign of an illness or another medical condition. °HOME CARE °· Pay attention to any changes in your cough. °· Take medicines only as told by your doctor. °· If you were prescribed an antibiotic medicine, take it as told by your doctor. Do not stop taking it even if you start to feel better. °· Talk with your doctor before you try using a cough medicine. °· Drink enough fluid to keep your pee (urine) clear or pale yellow. °· If the air is dry, use a cold steam vaporizer or humidifier in your home. °· Stay away from things that make you cough at work or at home. °· If your cough is worse at night, try using extra pillows to raise your head up higher while you sleep. °· Do not smoke, and try not to be around smoke. If you need help quitting, ask your doctor. °· Do not have caffeine. °· Do not drink alcohol. °· Rest as needed. °GET HELP IF: °· You have new problems (symptoms). °· You cough up yellow fluid (pus). °· Your cough does not get better after 2-3 weeks, or your cough gets worse. °· Medicine does not help your cough and you are not sleeping well. °· You have pain that gets worse or pain that is not helped with medicine. °· You have a fever. °· You are losing weight and you do not know why. °· You have night sweats. °GET HELP RIGHT AWAY IF: °· You cough up blood. °· You have trouble breathing. °· Your heartbeat is very fast. °  °This information is not intended to replace advice given to you by your health care provider. Make sure you discuss any questions you have with your health care provider. °  °Document Released: 11/04/2010 Document Revised: 11/12/2014 Document Reviewed: 04/30/2014 °Elsevier Interactive Patient Education ©2016 [employer] ° °Upper Respiratory Infection, Adult °Most upper respiratory  infections (URIs) are a viral infection of the air passages leading to the lungs. A URI affects the nose, throat, and upper air passages. The most common type of URI is nasopharyngitis and is typically referred to as "the common cold." °URIs run their course and usually go away on their own. Most of the time, a URI does not require medical attention, but sometimes a bacterial infection in the upper airways can follow a viral infection. This is called a secondary infection. Sinus and middle ear infections are common types of secondary upper respiratory infections. °Bacterial pneumonia can also complicate a URI. A URI can worsen asthma and chronic obstructive pulmonary disease (COPD). Sometimes, these complications can require emergency medical care and may be life threatening.  °CAUSES °Almost all URIs are caused by viruses. A virus is a type of germ and can spread from one person to another.  °RISKS FACTORS °You may be at risk for a URI if:  °· You smoke.   °· You have chronic heart or lung disease. °· You have a weakened defense (immune) system.   °· You are very young or very old.   °· You have nasal allergies or asthma. °· You work in crowded or poorly ventilated areas. °· You work in health care facilities or schools. °SIGNS AND SYMPTOMS  °Symptoms typically develop 2-3 days after you come in contact with a cold virus.   Most viral URIs last 7-10 days. However, viral URIs from the influenza virus (flu virus) can last 14-18 days and are typically more severe. Symptoms may include:  °· Runny or stuffy (congested) nose.   °· Sneezing.   °· Cough.   °· Sore throat.   °· Headache.   °· Fatigue.   °· Fever.   °· Loss of appetite.   °· Pain in your forehead, behind your eyes, and over your cheekbones (sinus pain). °· Muscle aches.   °DIAGNOSIS  °Your health care provider may diagnose a URI by: °· Physical exam. °· Tests to check that your symptoms are not due to another condition such as: °¨ Strep  throat. °¨ Sinusitis. °¨ Pneumonia. °¨ Asthma. °TREATMENT  °A URI goes away on its own with time. It cannot be cured with medicines, but medicines may be prescribed or recommended to relieve symptoms. Medicines may help: °· Reduce your fever. °· Reduce your cough. °· Relieve nasal congestion. °HOME CARE INSTRUCTIONS  °· Take medicines only as directed by your health care provider.   °· Gargle warm saltwater or take cough drops to comfort your throat as directed by your health care provider. °· Use a warm mist humidifier or inhale steam from a shower to increase air moisture. This may make it easier to breathe. °· Drink enough fluid to keep your urine clear or pale yellow.   °· Eat soups and other clear broths and maintain good nutrition.   °· Rest as needed.   °· Return to work when your temperature has returned to normal or as your health care provider advises. You may need to stay home longer to avoid infecting others. You can also use a face mask and careful hand washing to prevent spread of the virus. °· Increase the usage of your inhaler if you have asthma.   °· Do not use any tobacco products, including cigarettes, chewing tobacco, or electronic cigarettes. If you need help quitting, ask your health care provider. °PREVENTION  °The best way to protect yourself from getting a cold is to practice good hygiene.  °· Avoid oral or hand contact with people with cold symptoms.   °· Wash your hands often if contact occurs.   °There is no clear evidence that vitamin C, vitamin E, echinacea, or exercise reduces the chance of developing a cold. However, it is always recommended to get plenty of rest, exercise, and practice good nutrition.  °SEEK MEDICAL CARE IF:  °· You are getting worse rather than better.   °· Your symptoms are not controlled by medicine.   °· You have chills. °· You have worsening shortness of breath. °· You have brown or red mucus. °· You have yellow or brown nasal discharge. °· You have pain in your  face, especially when you bend forward. °· You have a fever. °· You have swollen neck glands. °· You have pain while swallowing. °· You have white areas in the back of your throat. °SEEK IMMEDIATE MEDICAL CARE IF:  °· You have severe or persistent: °¨ Headache. °¨ Ear pain. °¨ Sinus pain. °¨ Chest pain. °· You have chronic lung disease and any of the following: °¨ Wheezing. °¨ Prolonged cough. °¨ Coughing up blood. °¨ A change in your usual mucus. °· You have a stiff neck. °· You have changes in your: °¨ Vision. °¨ Hearing. °¨ Thinking. °¨ Mood. °MAKE SURE YOU:  °· Understand these instructions. °· Will watch your condition. °· Will get help right away if you are not doing well or get worse. °  °This information is not intended to replace   advice given to you by your health care provider. Make sure you discuss any questions you have with your health care provider. °  °Document Released: 08/17/2000 Document Revised: 07/08/2014 Document Reviewed: 05/29/2013 °Elsevier Interactive Patient Education ©2016 Elsevier Inc. ° °

## 2015-04-14 NOTE — MAU Provider Note (Signed)
History     CSN: [id number]  Arrival date and time: 04/14/15 1405   None     Chief Complaint  Patient presents with  . Fever  . Sore Throat  . Headache   HPI Comments: Christina Olsen is a 25yo AA female that is G5P1031 who is [redacted]w[redacted]d pregnant with no pmh that presents today for flu like symptoms and generalized abdominal pain. Patient states that her symptoms started yesterday. She complains of rhinorrhea, sinus congestion, ear fullness, sore throat, productive cough, chest congestion, headache subjective fever of 100, and generalized weakness. She has tried Tylenol and Benadryl without relief. She is unable to sleep at night. Patient states that "she was not able to go to work today and that her boss told her that she needs a doctors excuse."   Patient also states she has some generalized abd pain and lower abdominal pressure. She states it feels like "round ligament pain". She denies any N/V/D, contractions, vaginal bleeding, vaginal discharge, urinary symptoms or flank pain. No change in bowel habits.   OB History    Gravida Para Term Preterm AB TAB SAB Ectopic Multiple Living   5 1 1  3  3   1       Past Medical History  Diagnosis Date  . Chlamydia   . Trichimoniasis   . Urinary tract infection     Past Surgical History  Procedure Laterality Date  . Wisdom tooth extraction    . Fracture surgery  1999    right collarbone  . Cesarean section N/A 11/02/2012    Procedure: CESAREAN SECTION-Baby boy @2327  Apgar 9/9;  Surgeon: Michael Litter, MD;  Location: [facility];  Service: Obstetrics;  Laterality: N/A;  . Dilation and evacuation N/A 09/10/2014    Procedure: DILATATION AND EVACUATION;  Surgeon: Levie Heritage, DO;  Location: [facility];  Service: Gynecology;  Laterality: N/A;    Family History  Problem Relation Age of Onset  . Hypertension Mother   . Bipolar disorder Mother   . Hypertension Maternal Aunt   . Bipolar disorder Maternal Aunt   . Schizophrenia Maternal Aunt   . OCD  Maternal Aunt   . Hypertension Maternal Uncle   . Hypertension Maternal Grandmother     Social History  Substance Use Topics  . Smoking status: Former Games developer  . Smokeless tobacco: Never Used     Comment: black and mild  . Alcohol Use: No     Comment: occasional mixed drinks     Allergies:  Allergies  Allergen Reactions  . Shellfish-Derived Products Anaphylaxis and Swelling  . Sulfa Antibiotics Other (See Comments)    Reaction:  Unknown    Prescriptions prior to admission  Medication Sig Dispense Refill Last Dose  . acetaminophen (TYLENOL) 500 MG tablet Take 500 mg by mouth every 6 (six) hours as needed for mild pain or headache.   Past Week at Unknown time  . diphenhydrAMINE (BENADRYL) 25 mg capsule Take 25 mg by mouth every 6 (six) hours as needed for itching or allergies.    Past Month at Unknown time  . metoCLOPramide (REGLAN) 10 MG tablet Take 1 tablet (10 mg total) by mouth every 6 (six) hours. (Patient taking differently: Take 10 mg by mouth every 6 (six) hours as needed for nausea or vomiting. ) 30 tablet 0 Past Week at Unknown time  . Prenatal Vit-Fe Fumarate-FA (PRENATAL MULTIVITAMIN) TABS tablet Take 1 tablet by mouth at bedtime.   Past Week at Unknown time  .  promethazine (PHENERGAN) 25 MG tablet Take 1 tablet (25 mg total) by mouth every 6 (six) hours as needed for nausea or vomiting. 30 tablet 0 Past Week at Unknown time   Results for orders placed or performed during the hospital encounter of 04/14/15 (from the past 24 hour(s))  Urinalysis, Routine w reflex microscopic (not at [facility])     Status: Abnormal   Collection Time: 04/14/15  2:12 PM  Result Value Ref Range   Color, Urine YELLOW YELLOW   APPearance HAZY (A) CLEAR   Specific Gravity, Urine >1.030 (H) 1.005 - 1.030   pH 6.0 5.0 - 8.0   Glucose, UA NEGATIVE NEGATIVE mg/dL   Hgb urine dipstick NEGATIVE NEGATIVE   Bilirubin Urine NEGATIVE NEGATIVE   Ketones, ur NEGATIVE NEGATIVE mg/dL   Protein, ur 30 (A)  NEGATIVE mg/dL   Nitrite NEGATIVE NEGATIVE   Leukocytes, UA SMALL (A) NEGATIVE  Urine microscopic-add on     Status: Abnormal   Collection Time: 04/14/15  2:12 PM  Result Value Ref Range   Squamous Epithelial / LPF 0-5 (A) NONE SEEN   WBC, UA 6-30 0 - 5 WBC/hpf   RBC / HPF 0-5 0 - 5 RBC/hpf   Bacteria, UA MANY (A) NONE SEEN   Urine-Other MUCOUS PRESENT     Review of Systems  Constitutional: Positive for fever (Subjective 100) and chills.  HENT: Positive for congestion and sore throat. Negative for ear pain.   Respiratory: Positive for cough and sputum production (yellow sputum). Negative for shortness of breath and wheezing.   Cardiovascular: Negative for chest pain and palpitations.  Gastrointestinal: Positive for abdominal pain (Generalized). Negative for nausea, vomiting and diarrhea.  Genitourinary: Negative for dysuria, urgency, frequency and hematuria.  Neurological: Positive for weakness and headaches.   Physical Exam   Blood pressure 125/62, pulse 80, temperature 98.6 F (37 C), temperature source Oral, resp. rate 18, height 5' 4.96" (1.65 m), weight 91.627 kg (202 lb), last menstrual period 11/26/2014, SpO2 99 %, unknown if currently breastfeeding.  Physical Exam  Constitutional: She is oriented to person, place, and time. She appears well-developed and well-nourished. No distress (Patient sitting up in stretcher eating crackers and drinking juice).  HENT:  Head: Normocephalic and atraumatic.  Right Ear: Tympanic membrane, external ear and ear canal normal.  Left Ear: Tympanic membrane, external ear and ear canal normal.  Nose: Rhinorrhea (Turbinates erythematous) present. Right sinus exhibits maxillary sinus tenderness and frontal sinus tenderness. Left sinus exhibits maxillary sinus tenderness and frontal sinus tenderness.  Mouth/Throat: Posterior oropharyngeal edema and posterior oropharyngeal erythema present. No oropharyngeal exudate.  Eyes: Pupils are equal, round,  and reactive to light.  Cardiovascular: Normal rate, regular rhythm and normal heart sounds.   Respiratory: Effort normal and breath sounds normal. No respiratory distress. She has no wheezes. She has no rales.  GI: Soft. Bowel sounds are normal. She exhibits no distension. There is no tenderness. There is no rebound and no guarding.  Lymphadenopathy:  No lymphadenopathy, no tenderness to palpation  Neurological: She is alert and oriented to person, place, and time.  Skin: Skin is warm and dry.    MAU Course  Procedures  MDM Rapid influenza; Influenza results are taking abnormally long time. Pt states her symptoms have been for 4 days and will call pt results if positive for flu. Will treat for URI since pt is complaining of cough, yellow phlegm. Follow up with [facility] as needed and scheduled.    Assessment and Plan  Upper  respiratory infection Z-Pac Tessalon pearls  #30 Discharge    Chyler Creely Grissett 04/14/2015, 5:06 PM

## 2015-06-21 ENCOUNTER — Encounter (HOSPITAL_COMMUNITY): Payer: Self-pay | Admitting: *Deleted

## 2015-06-21 ENCOUNTER — Inpatient Hospital Stay (HOSPITAL_COMMUNITY)
Admission: AD | Admit: 2015-06-21 | Discharge: 2015-06-21 | Disposition: A | Discharge status: Home / Self Care | Payer: Medicaid Other | Source: Ambulatory Visit | Attending: Obstetrics and Gynecology | Admitting: Obstetrics and Gynecology

## 2015-06-21 DIAGNOSIS — R05 Cough: Secondary | ICD-10-CM | POA: Insufficient documentation

## 2015-06-21 DIAGNOSIS — O9989 Other specified diseases and conditions complicating pregnancy, childbirth and the puerperium: Secondary | ICD-10-CM | POA: Diagnosis not present

## 2015-06-21 DIAGNOSIS — Z87891 Personal history of nicotine dependence: Secondary | ICD-10-CM | POA: Insufficient documentation

## 2015-06-21 DIAGNOSIS — R0602 Shortness of breath: Secondary | ICD-10-CM | POA: Diagnosis present

## 2015-06-21 DIAGNOSIS — O26893 Other specified pregnancy related conditions, third trimester: Secondary | ICD-10-CM

## 2015-06-21 DIAGNOSIS — R0600 Dyspnea, unspecified: Secondary | ICD-10-CM

## 2015-06-21 DIAGNOSIS — J069 Acute upper respiratory infection, unspecified: Secondary | ICD-10-CM | POA: Diagnosis not present

## 2015-06-21 DIAGNOSIS — B349 Viral infection, unspecified: Secondary | ICD-10-CM | POA: Insufficient documentation

## 2015-06-21 DIAGNOSIS — Z3A29 29 weeks gestation of pregnancy: Secondary | ICD-10-CM | POA: Insufficient documentation

## 2015-06-21 DIAGNOSIS — R059 Cough, unspecified: Secondary | ICD-10-CM

## 2015-06-21 LAB — URINALYSIS, ROUTINE W REFLEX MICROSCOPIC
BILIRUBIN URINE: NEGATIVE
GLUCOSE, UA: NEGATIVE mg/dL
Hgb urine dipstick: NEGATIVE
KETONES UR: NEGATIVE mg/dL
Nitrite: NEGATIVE
PH: 6 (ref 5.0–8.0)
Protein, ur: NEGATIVE mg/dL
SPECIFIC GRAVITY, URINE: 1.02 (ref 1.005–1.030)

## 2015-06-21 LAB — URINE MICROSCOPIC-ADD ON

## 2015-06-21 MED ORDER — BENZONATATE 100 MG PO CAPS
200.0000 mg | ORAL_CAPSULE | Freq: Once | ORAL | Status: AC
  Administered 2015-06-21: 200 mg via ORAL
  Filled 2015-06-21: qty 2

## 2015-06-21 NOTE — MAU Provider Note (Signed)
History     CSN: [id number]  Arrival date and time: 06/21/15 0430   First Provider Initiated Contact with Patient 06/21/15 [phone]      Chief Complaint  Patient presents with  . Shortness of Breath  . Cough   HPI    Ms.Christina Olsen is a 25 y.o. female [phone] @ 6310w4d with no history of Asthma presents to MAU with complaints of shortness of breath, wheezing, and cough. Symptoms have been present for 3 days, although the patient was also seen in MAU for this same complaint a month ago. She has taken benadryl, robitussin, cough drops and hot tea. She also used her sons inhaler this morning.  She does not feel like anything is helping her symptoms. The shortness of breath has been present throughout pregnancy, however worse in the last 3 days.   She denies chest pain, the shortness of breath worsens with activity. The patient was scheduled  to go into work today, however does not feel well enough to work.   She denies fever, although feels like she may be feverish. "my whole body hurts".   OB History    Gravida Para Term Preterm AB TAB SAB Ectopic Multiple Living   5 1 1  3  3   1       Past Medical History  Diagnosis Date  . Chlamydia   . Trichimoniasis   . Urinary tract infection     Past Surgical History  Procedure Laterality Date  . Wisdom tooth extraction    . Fracture surgery  1999    right collarbone  . Cesarean section N/A 11/02/2012    Procedure: CESAREAN SECTION-Baby boy @2327  Apgar 9/9;  Surgeon: Michael LitterNaima A Dillard, MD;  Location: [facility];  Service: Obstetrics;  Laterality: N/A;  . Dilation and evacuation N/A 09/10/2014    Procedure: DILATATION AND EVACUATION;  Surgeon: Levie HeritageJacob J Stinson, DO;  Location: [facility];  Service: Gynecology;  Laterality: N/A;    Family History  Problem Relation Age of Onset  . Hypertension Mother   . Bipolar disorder Mother   . Hypertension Maternal Aunt   . Bipolar disorder Maternal Aunt   . Schizophrenia Maternal Aunt   . OCD  Maternal Aunt   . Hypertension Maternal Uncle   . Hypertension Maternal Grandmother     Social History  Substance Use Topics  . Smoking status: Former Games developermoker  . Smokeless tobacco: Never Used     Comment: black and mild  . Alcohol Use: No     Comment: occasional mixed drinks     Allergies:  Allergies  Allergen Reactions  . Shellfish-Derived Products Anaphylaxis and Swelling  . Sulfa Antibiotics Other (See Comments)    Reaction:  Unknown    Prescriptions prior to admission  Medication Sig Dispense Refill Last Dose  . acetaminophen (TYLENOL) 500 MG tablet Take 500 mg by mouth every 6 (six) hours as needed for mild pain or headache.   06/20/2015 at Unknown time  . diphenhydrAMINE (BENADRYL) 25 mg capsule Take 25 mg by mouth every 6 (six) hours as needed for itching or allergies.    06/20/2015 at Unknown time  . Prenatal Vit-Fe Fumarate-FA (PRENATAL MULTIVITAMIN) TABS tablet Take 1 tablet by mouth at bedtime.   06/20/2015 at Unknown time  . benzonatate (TESSALON PERLES) 100 MG capsule Take 1 capsule (100 mg total) by mouth 3 (three) times daily as needed for cough. 30 capsule 0   . metoCLOPramide (REGLAN) 10 MG tablet Take 1 tablet (10  mg total) by mouth every 6 (six) hours. (Patient taking differently: Take 10 mg by mouth every 6 (six) hours as needed for nausea or vomiting. ) 30 tablet 0 Past Week at Unknown time  . promethazine (PHENERGAN) 25 MG tablet Take 1 tablet (25 mg total) by mouth every 6 (six) hours as needed for nausea or vomiting. 30 tablet 0 Past Week at Unknown time   Results for orders placed or performed during the hospital encounter of 06/21/15 (from the past 48 hour(s))  Urinalysis, Routine w reflex microscopic (not at [facility])     Status: Abnormal   Collection Time: 06/21/15  4:44 AM  Result Value Ref Range   Color, Urine YELLOW YELLOW   APPearance CLEAR CLEAR   Specific Gravity, Urine 1.020 1.005 - 1.030   pH 6.0 5.0 - 8.0   Glucose, UA NEGATIVE NEGATIVE mg/dL   Hgb  urine dipstick NEGATIVE NEGATIVE   Bilirubin Urine NEGATIVE NEGATIVE   Ketones, ur NEGATIVE NEGATIVE mg/dL   Protein, ur NEGATIVE NEGATIVE mg/dL   Nitrite NEGATIVE NEGATIVE   Leukocytes, UA SMALL (A) NEGATIVE  Urine microscopic-add on     Status: Abnormal   Collection Time: 06/21/15  4:44 AM  Result Value Ref Range   Squamous Epithelial / LPF 0-5 (A) NONE SEEN   WBC, UA 0-5 0 - 5 WBC/hpf   RBC / HPF 0-5 0 - 5 RBC/hpf   Bacteria, UA FEW (A) NONE SEEN    Review of Systems  Constitutional: Positive for chills. Negative for fever.  HENT: Positive for congestion.   Respiratory: Positive for cough, shortness of breath and wheezing.   Cardiovascular: Negative for chest pain, palpitations and leg swelling.  Gastrointestinal: Negative for nausea and vomiting.  Genitourinary: Negative for dysuria.   Physical Exam   Blood pressure 126/67, pulse 87, temperature 97.4 F (36.3 C), temperature source Oral, resp. rate 18, height  (1.676 m), weight 211 lb (95.709 kg), last menstrual period 11/26/2014, SpO2 99 %, not currently breastfeeding.  Physical Exam  Nursing note and vitals reviewed. Constitutional: She is oriented to person, place, and time. Vital signs are normal. She appears well-developed and well-nourished.  Non-toxic appearance. She does not have a sickly appearance. She does not appear ill. No distress.  HENT:  Head: Normocephalic.  Neck: Normal range of motion. Neck supple.  Cardiovascular: Normal rate.   Respiratory: Effort normal and breath sounds normal. No respiratory distress. She has no wheezes. She has no rales. She exhibits no tenderness.  GI: Soft. There is no tenderness.  Musculoskeletal: Normal range of motion.  Neurological: She is alert and oriented to person, place, and time.  Skin: Skin is warm. She is not diaphoretic.  Psychiatric: Her affect is angry (Patient argumenative. ).   Fetal Tracing: Baseline: 135 bpm  Variability: Moderate  Accelerations:  15x15 Decelerations: quick variables  Toco: none  MAU Course  Procedures  None  MDM  Patient has an office appointment on 4/18 Patient requesting work note to be off of work today due to her cold symptoms Tesslon Pearls given in MAU. Patient unable to fill tessalon pearls RX from last visit due to insurance; patient has medicaid.   Pulse on 98%-100% on RA.   Discussed patient with Dr. Claiborne Billings. Patient not in any distress at this time. Coughing, shortness of breath not observed in MAU.   Assessment and Plan   A:  1. Upper respiratory virus   2. Shortness of breath due to pregnancy, third trimester  3. Cough     P:  Discharge home in stable condition Cool mist humidifier Keep your scheduled appointment in the office on 4/18 Return to MAU if symptoms worsen  Fetal kick counts    Duane Lope, NP  06/21/2015  9:48 AM

## 2015-06-21 NOTE — MAU Note (Addendum)
3 Days of SOB and coughing. Body pain. No leaking or bleeding. No problems with pregnancy. Throat is very sore No history of asthma, No chest pains, but chest is congested and hurts when she coughs. PT could not afford the Tessalon Pearlas and INS did not cover them.

## 2015-06-21 NOTE — Discharge Instructions (Signed)
Allergies °An allergy is an abnormal reaction to a substance by the body's defense system (immune system). Allergies can develop at any age. °WHAT CAUSES ALLERGIES? °An allergic reaction happens when the immune system mistakenly reacts to a normally harmless substance, called an allergen, as if it were harmful. The immune system releases antibodies to fight the substance. Antibodies eventually release a chemical called histamine into the bloodstream. The release of histamine is meant to protect the body from infection, but it also causes discomfort. °An allergic reaction can be triggered by: °· Eating an allergen. °· Inhaling an allergen. °· Touching an allergen. °WHAT TYPES OF ALLERGIES ARE THERE? °There are many types of allergies. Common types include: °· Seasonal allergies. People with this type of allergy are usually allergic to substances that are only present during certain seasons, such as molds and pollens. °· Food allergies. °· Drug allergies. °· Insect allergies. °· Animal dander allergies. °WHAT ARE SYMPTOMS OF ALLERGIES? °Possible allergy symptoms include: °· Swelling of the lips, face, tongue, mouth, or throat. °· Sneezing, coughing, or wheezing. °· Nasal congestion. °· Tingling in the mouth. °· Rash. °· Itching. °· Itchy, red, swollen areas of skin (hives). °· Watery eyes. °· Vomiting. °· Diarrhea. °· Dizziness. °· Lightheadedness. °· Fainting. °· Trouble breathing or swallowing. °· Chest tightness. °· Rapid heartbeat. °HOW ARE ALLERGIES DIAGNOSED? °Allergies are diagnosed with a medical and family history and one or more of the following: °· Skin tests. °· Blood tests. °· A food diary. A food diary is a record of all the foods and drinks you have in a day and of all the symptoms you experience. °· The results of an elimination diet. An elimination diet involves eliminating foods from your diet and then adding them back in one by one to find out if a certain food causes an allergic reaction. °HOW ARE  ALLERGIES TREATED? °There is no cure for allergies, but allergic reactions can be treated with medicine. Severe reactions usually need to be treated at a hospital. °HOW CAN REACTIONS BE PREVENTED? °The best way to prevent an allergic reaction is by avoiding the substance you are allergic to. Allergy shots and medicines can also help prevent reactions in some cases. People with severe allergic reactions may be able to prevent a life-threatening reaction called anaphylaxis with a medicine given right after exposure to the allergen. °  °This information is not intended to replace advice given to you by your health care provider. Make sure you discuss any questions you have with your health care provider. °  °Document Released: 05/17/2002 Document Revised: 03/14/2014 Document Reviewed: 12/03/2013 °Elsevier Interactive Patient Education ©2016 [employer] ° °Cool Mist Vaporizers °Vaporizers may help relieve the symptoms of a cough and cold. They add moisture to the air, which helps mucus to become thinner and less sticky. This makes it easier to breathe and cough up secretions. Cool mist vaporizers do not cause serious burns like hot mist vaporizers, which may also be called steamers or humidifiers. Vaporizers have not been proven to help with colds. You should not use a vaporizer if you are allergic to mold. °HOME CARE INSTRUCTIONS °· Follow the package instructions for the vaporizer. °· Do not use anything other than distilled water in the vaporizer. °· Do not run the vaporizer all of the time. This can cause mold or bacteria to grow in the vaporizer. °· Clean the vaporizer after each time it is used. °· Clean and dry the vaporizer well before storing it. °· Stop   using the vaporizer if worsening respiratory symptoms develop. °  °This information is not intended to replace advice given to you by your health care provider. Make sure you discuss any questions you have with your health care provider. °  °Document  Released: 11/19/2003 Document Revised: 02/26/2013 Document Reviewed: 07/11/2012 °Elsevier Interactive Patient Education ©2016 [employer] ° °

## 2015-08-04 ENCOUNTER — Other Ambulatory Visit: Payer: Self-pay | Admitting: Obstetrics & Gynecology

## 2015-08-04 LAB — OB RESULTS CONSOLE GBS: STREP GROUP B AG: POSITIVE

## 2015-08-18 ENCOUNTER — Other Ambulatory Visit: Payer: Self-pay | Admitting: Obstetrics & Gynecology

## 2015-08-25 ENCOUNTER — Inpatient Hospital Stay (HOSPITAL_COMMUNITY)
Admission: AD | Admit: 2015-08-25 | Discharge: 2015-08-25 | Disposition: A | Discharge status: Home / Self Care | Payer: Medicaid Other | Source: Ambulatory Visit | Attending: Obstetrics & Gynecology | Admitting: Obstetrics & Gynecology

## 2015-08-25 ENCOUNTER — Encounter (HOSPITAL_COMMUNITY): Payer: Self-pay | Admitting: *Deleted

## 2015-08-25 DIAGNOSIS — Z3493 Encounter for supervision of normal pregnancy, unspecified, third trimester: Secondary | ICD-10-CM | POA: Diagnosis not present

## 2015-08-25 NOTE — Discharge Instructions (Signed)
Braxton Hicks Contractions °Contractions of the uterus can occur throughout pregnancy. Contractions are not always a sign that you are in labor.  °WHAT ARE BRAXTON HICKS CONTRACTIONS?  °Contractions that occur before labor are called Braxton Hicks contractions, or false labor. Toward the end of pregnancy (32-34 weeks), these contractions can develop more often and may become more forceful. This is not true labor because these contractions do not result in opening (dilatation) and thinning of the cervix. They are sometimes difficult to tell apart from true labor because these contractions can be forceful and people have different pain tolerances. You should not feel embarrassed if you go to the hospital with false labor. Sometimes, the only way to tell if you are in true labor is for your health care provider to look for changes in the cervix. °If there are no prenatal problems or other health problems associated with the pregnancy, it is completely safe to be sent home with false labor and await the onset of true labor. °HOW CAN YOU TELL THE DIFFERENCE BETWEEN TRUE AND FALSE LABOR? °False Labor °· The contractions of false labor are usually shorter and not as hard as those of true labor.   °· The contractions are usually irregular.   °· The contractions are often felt in the front of the lower abdomen and in the groin.   °· The contractions may go away when you walk around or change positions while lying down.   °· The contractions get weaker and are shorter lasting as time goes on.   °· The contractions do not usually become progressively stronger, regular, and closer together as with true labor.   °True Labor °1. Contractions in true labor last 30-70 seconds, become very regular, usually become more intense, and increase in frequency.   °2. The contractions do not go away with walking.   °3. The discomfort is usually felt in the top of the uterus and spreads to the lower abdomen and low back.   °4. True labor can  be determined by your health care provider with an exam. This will show that the cervix is dilating and getting thinner.   °WHAT TO REMEMBER °· Keep up with your usual exercises and follow other instructions given by your health care provider.   °· Take medicines as directed by your health care provider.   °· Keep your regular prenatal appointments.   °· Eat and drink lightly if you think you are going into labor.   °· If Braxton Hicks contractions are making you uncomfortable:   °· Change your position from lying down or resting to walking, or from walking to resting.   °· Sit and rest in a tub of warm water.   °· Drink 2-3 glasses of water. Dehydration may cause these contractions.   °· Do slow and deep breathing several times an hour.   °WHEN SHOULD I SEEK IMMEDIATE MEDICAL CARE? °Seek immediate medical care if: °· Your contractions become stronger, more regular, and closer together.   °· You have fluid leaking or gushing from your vagina.   °· You have a fever.   °· You pass blood-tinged mucus.   °· You have vaginal bleeding.   °· You have continuous abdominal pain.   °· You have low back pain that you never had before.   °· You feel your baby's head pushing down and causing pelvic pressure.   °· Your baby is not moving as much as it used to.   °  °This information is not intended to replace advice given to you by your health care provider. Make sure you discuss any questions you have with your health care   provider. °  °Document Released: 02/21/2005 Document Revised: 02/26/2013 Document Reviewed: 12/03/2012 °Elsevier Interactive Patient Education ©2016 [employer] ° °Fetal Movement Counts °Patient Name: __________________________________________________ Patient Due Date: ____________________ °Performing a fetal movement count is highly recommended in high-risk pregnancies, but it is good for every pregnant woman to do. Your health care provider may ask you to start counting fetal movements at 28 weeks of the  pregnancy. Fetal movements often increase: °· After eating a full meal. °· After physical activity. °· After eating or drinking something sweet or cold. °· At rest. °Pay attention to when you feel the baby is most active. This will help you notice a pattern of your baby's sleep and wake cycles and what factors contribute to an increase in fetal movement. It is important to perform a fetal movement count at the same time each day when your baby is normally most active.  °HOW TO COUNT FETAL MOVEMENTS °5. Find a quiet and comfortable area to sit or lie down on your left side. Lying on your left side provides the best blood and oxygen circulation to your baby. °6. Write down the day and time on a sheet of paper or in a journal. °7. Start counting kicks, flutters, swishes, rolls, or jabs in a 2-hour period. You should feel at least 10 movements within 2 hours. °8. If you do not feel 10 movements in 2 hours, wait 2-3 hours and count again. Look for a change in the pattern or not enough counts in 2 hours. °SEEK MEDICAL CARE IF: °· You feel less than 10 counts in 2 hours, tried twice. °· There is no movement in over an hour. °· The pattern is changing or taking longer each day to reach 10 counts in 2 hours. °· You feel the baby is not moving as he or she usually does. °Date: ____________ Movements: ____________ Start time: ____________ Finish time: ____________  °Date: ____________ Movements: ____________ Start time: ____________ Finish time: ____________ °Date: ____________ Movements: ____________ Start time: ____________ Finish time: ____________ °Date: ____________ Movements: ____________ Start time: ____________ Finish time: ____________ °Date: ____________ Movements: ____________ Start time: ____________ Finish time: ____________ °Date: ____________ Movements: ____________ Start time: ____________ Finish time: ____________ °Date: ____________ Movements: ____________ Start time: ____________ Finish time:  ____________ °Date: ____________ Movements: ____________ Start time: ____________ Finish time: ____________  °Date: ____________ Movements: ____________ Start time: ____________ Finish time: ____________ °Date: ____________ Movements: ____________ Start time: ____________ Finish time: ____________ °Date: ____________ Movements: ____________ Start time: ____________ Finish time: ____________ °Date: ____________ Movements: ____________ Start time: ____________ Finish time: ____________ °Date: ____________ Movements: ____________ Start time: ____________ Finish time: ____________ °Date: ____________ Movements: ____________ Start time: ____________ Finish time: ____________ °Date: ____________ Movements: ____________ Start time: ____________ Finish time: ____________  °Date: ____________ Movements: ____________ Start time: ____________ Finish time: ____________ °Date: ____________ Movements: ____________ Start time: ____________ Finish time: ____________ °Date: ____________ Movements: ____________ Start time: ____________ Finish time: ____________ °Date: ____________ Movements: ____________ Start time: ____________ Finish time: ____________ °Date: ____________ Movements: ____________ Start time: ____________ Finish time: ____________ °Date: ____________ Movements: ____________ Start time: ____________ Finish time: ____________ °Date: ____________ Movements: ____________ Start time: ____________ Finish time: ____________  °Date: ____________ Movements: ____________ Start time: ____________ Finish time: ____________ °Date: ____________ Movements: ____________ Start time: ____________ Finish time: ____________ °Date: ____________ Movements: ____________ Start time: ____________ Finish time: ____________ °Date: ____________ Movements: ____________ Start time: ____________ Finish time: ____________ °Date: ____________ Movements: ____________ Start time: ____________ Finish time: ____________ °Date: ____________ Movements:  ____________ Start time: ____________ Finish   time: ____________ °Date: ____________ Movements: ____________ Start time: ____________ Finish time: ____________  °Date: ____________ Movements: ____________ Start time: ____________ Finish time: ____________ °Date: ____________ Movements: ____________ Start time: ____________ Finish time: ____________ °Date: ____________ Movements: ____________ Start time: ____________ Finish time: ____________ °Date: ____________ Movements: ____________ Start time: ____________ Finish time: ____________ °Date: ____________ Movements: ____________ Start time: ____________ Finish time: ____________ °Date: ____________ Movements: ____________ Start time: ____________ Finish time: ____________ °Date: ____________ Movements: ____________ Start time: ____________ Finish time: ____________  °Date: ____________ Movements: ____________ Start time: ____________ Finish time: ____________ °Date: ____________ Movements: ____________ Start time: ____________ Finish time: ____________ °Date: ____________ Movements: ____________ Start time: ____________ Finish time: ____________ °Date: ____________ Movements: ____________ Start time: ____________ Finish time: ____________ °Date: ____________ Movements: ____________ Start time: ____________ Finish time: ____________ °Date: ____________ Movements: ____________ Start time: ____________ Finish time: ____________ °Date: ____________ Movements: ____________ Start time: ____________ Finish time: ____________  °Date: ____________ Movements: ____________ Start time: ____________ Finish time: ____________ °Date: ____________ Movements: ____________ Start time: ____________ Finish time: ____________ °Date: ____________ Movements: ____________ Start time: ____________ Finish time: ____________ °Date: ____________ Movements: ____________ Start time: ____________ Finish time: ____________ °Date: ____________ Movements: ____________ Start time: ____________ Finish  time: ____________ °Date: ____________ Movements: ____________ Start time: ____________ Finish time: ____________ °Date: ____________ Movements: ____________ Start time: ____________ Finish time: ____________  °Date: ____________ Movements: ____________ Start time: ____________ Finish time: ____________ °Date: ____________ Movements: ____________ Start time: ____________ Finish time: ____________ °Date: ____________ Movements: ____________ Start time: ____________ Finish time: ____________ °Date: ____________ Movements: ____________ Start time: ____________ Finish time: ____________ °Date: ____________ Movements: ____________ Start time: ____________ Finish time: ____________ °Date: ____________ Movements: ____________ Start time: ____________ Finish time: ____________ °  °This information is not intended to replace advice given to you by your health care provider. Make sure you discuss any questions you have with your health care provider. °  °Document Released: 03/23/2006 Document Revised: 03/14/2014 Document Reviewed: 12/19/2011 °Elsevier Interactive Patient Education ©2016 [employer] ° °

## 2015-08-31 ENCOUNTER — Encounter (HOSPITAL_COMMUNITY): Payer: Self-pay | Admitting: *Deleted

## 2015-08-31 ENCOUNTER — Inpatient Hospital Stay (HOSPITAL_COMMUNITY)
Admission: AD | Admit: 2015-08-31 | Discharge: 2015-08-31 | Disposition: A | Discharge status: Home / Self Care | Payer: Medicaid Other | Source: Ambulatory Visit | Attending: Obstetrics and Gynecology | Admitting: Obstetrics and Gynecology

## 2015-08-31 NOTE — Progress Notes (Signed)
Patient ID: Lorri FrederickLarissa Shimone Olsen, female   DOB: 10/30/1990, 25 y.o.   MRN: [id number]  S: Uncomfortable with contractions. Pt presents for evaluation of leaking fluid. She has a history of a prior cesarean for failure to progress. She desires VBAC. She was seen last week for r/o labor and her cervical exam was 1/50/-2 O:  Filed Vitals:   08/31/15 1239  Height: 5\' 6"  (1.676 m)  Weight: 98.431 kg (217 lb)   AOX3, NAD FHR 130, reactive, category 1 tracing Cvx 1/th/-2 SSE: Visually closed, pool negative  Fern negative  A/P 1) No evidence of SROM 2) Reactive NST, category 1 tracing 3) Keep F/U appointment

## 2015-08-31 NOTE — Progress Notes (Signed)
Patient given discharge instructions 

## 2015-08-31 NOTE — OB Triage Note (Signed)
Contraction every 2-5 minutes. Noticed panty wetness around 5am

## 2015-08-31 NOTE — MAU Note (Signed)
Pt to be triaged in room 175.

## 2015-08-31 NOTE — Anesthesia Pain Management Evaluation Note (Signed)
  [facility] Pain Management Visit Note  Patient: Christina Olsen, 25 y.o., female  "Hello I am a member of the anesthesia team at [facility]. We have an anesthesia team available at all times to provide care throughout the hospital, including epidural management and anesthesia for C-section. I don't know your plan for the delivery whether it a natural birth, water birth, IV sedation, nitrous supplementation, doula or epidural, but we want to meet your pain goals."   1.Was your pain managed to your expectations on prior hospitalizations?   Yes   2.What is your expectation for pain management during this hospitalization?     Epidural and IV pain meds  3.How can we help you reach that goal? IV Pain RX and epidural   Record the patient's initial score and the patient's pain goal.   Pain: 8  Pain Goal: 10 [facility] wants you to be able to say your pain was always managed very well.  Christina Olsen 08/31/2015

## 2015-09-04 ENCOUNTER — Inpatient Hospital Stay (HOSPITAL_COMMUNITY)
Admission: AD | Admit: 2015-09-04 | Discharge: 2015-09-06 | DRG: 775 | Disposition: A | Discharge status: Home / Self Care | Payer: Medicaid Other | Source: Ambulatory Visit | Attending: Obstetrics and Gynecology | Admitting: Obstetrics and Gynecology

## 2015-09-04 ENCOUNTER — Inpatient Hospital Stay (HOSPITAL_COMMUNITY): Payer: Medicaid Other | Admitting: Anesthesiology

## 2015-09-04 ENCOUNTER — Encounter (HOSPITAL_COMMUNITY): Payer: Self-pay | Admitting: *Deleted

## 2015-09-04 DIAGNOSIS — O34211 Maternal care for low transverse scar from previous cesarean delivery: Secondary | ICD-10-CM | POA: Diagnosis present

## 2015-09-04 DIAGNOSIS — Z3A40 40 weeks gestation of pregnancy: Secondary | ICD-10-CM

## 2015-09-04 DIAGNOSIS — O99824 Streptococcus B carrier state complicating childbirth: Secondary | ICD-10-CM | POA: Diagnosis present

## 2015-09-04 DIAGNOSIS — Z87891 Personal history of nicotine dependence: Secondary | ICD-10-CM

## 2015-09-04 DIAGNOSIS — Z3490 Encounter for supervision of normal pregnancy, unspecified, unspecified trimester: Secondary | ICD-10-CM

## 2015-09-04 LAB — TYPE AND SCREEN
ABO/RH(D): A POS
Antibody Screen: NEGATIVE

## 2015-09-04 LAB — RPR: RPR: NONREACTIVE

## 2015-09-04 LAB — CBC
HEMATOCRIT: 34.5 % — AB (ref 36.0–46.0)
HEMOGLOBIN: 11.8 g/dL — AB (ref 12.0–15.0)
MCH: 25.9 pg — AB (ref 26.0–34.0)
MCHC: 34.2 g/dL (ref 30.0–36.0)
MCV: 75.8 fL — AB (ref 78.0–100.0)
PLATELETS: 157 10*3/uL (ref 150–400)
RBC: 4.55 MIL/uL (ref 3.87–5.11)
RDW: 16.1 % — ABNORMAL HIGH (ref 11.5–15.5)
WBC: 11.8 10*3/uL — ABNORMAL HIGH (ref 4.0–10.5)

## 2015-09-04 MED ORDER — BUTORPHANOL TARTRATE 1 MG/ML IJ SOLN
1.0000 mg | INTRAMUSCULAR | Status: DC | PRN
  Administered 2015-09-04 (×4): 1 mg via INTRAVENOUS
  Filled 2015-09-04 (×4): qty 1

## 2015-09-04 MED ORDER — OXYTOCIN BOLUS FROM INFUSION
500.0000 mL | INTRAVENOUS | Status: DC
  Administered 2015-09-05: 500 mL/h via INTRAVENOUS

## 2015-09-04 MED ORDER — EPHEDRINE 5 MG/ML INJ
10.0000 mg | INTRAVENOUS | Status: DC | PRN
  Filled 2015-09-04: qty 2

## 2015-09-04 MED ORDER — ACETAMINOPHEN 325 MG PO TABS: 650.0000 mg | ORAL_TABLET | ORAL | Status: DC | PRN

## 2015-09-04 MED ORDER — OXYTOCIN 40 UNITS IN LACTATED RINGERS INFUSION - SIMPLE MED
2.5000 [IU]/h | INTRAVENOUS | Status: DC
  Filled 2015-09-04: qty 1000

## 2015-09-04 MED ORDER — DIPHENHYDRAMINE HCL 50 MG/ML IJ SOLN
12.5000 mg | INTRAMUSCULAR | Status: DC | PRN
  Administered 2015-09-04 (×2): 12.5 mg via INTRAVENOUS
  Filled 2015-09-04 (×2): qty 1

## 2015-09-04 MED ORDER — FLEET ENEMA 7-19 GM/118ML RE ENEM: 1.0000 | ENEMA | RECTAL | Status: DC | PRN

## 2015-09-04 MED ORDER — LIDOCAINE HCL (PF) 1 % IJ SOLN
30.0000 mL | INTRAMUSCULAR | Status: DC | PRN
Start: 2015-09-04 — End: 2015-09-05
  Filled 2015-09-04: qty 30

## 2015-09-04 MED ORDER — OXYCODONE-ACETAMINOPHEN 5-325 MG PO TABS
1.0000 | ORAL_TABLET | ORAL | Status: DC | PRN
  Administered 2015-09-05: 1 via ORAL
  Filled 2015-09-04: qty 1

## 2015-09-04 MED ORDER — FENTANYL 2.5 MCG/ML BUPIVACAINE 1/10 % EPIDURAL INFUSION (WH - ANES)
14.0000 mL/h | INTRAMUSCULAR | Status: DC | PRN
  Administered 2015-09-04 (×2): 14 mL/h via EPIDURAL
  Filled 2015-09-04: qty 125

## 2015-09-04 MED ORDER — PHENYLEPHRINE 40 MCG/ML (10ML) SYRINGE FOR IV PUSH (FOR BLOOD PRESSURE SUPPORT)
80.0000 ug | PREFILLED_SYRINGE | INTRAVENOUS | Status: DC | PRN
  Filled 2015-09-04: qty 5

## 2015-09-04 MED ORDER — EPHEDRINE 5 MG/ML INJ: 10.0000 mg | INTRAVENOUS | Status: DC | PRN

## 2015-09-04 MED ORDER — SOD CITRATE-CITRIC ACID 500-334 MG/5ML PO SOLN: 30.0000 mL | ORAL | Status: DC | PRN

## 2015-09-04 MED ORDER — OXYCODONE-ACETAMINOPHEN 5-325 MG PO TABS: 2.0000 | ORAL_TABLET | ORAL | Status: DC | PRN

## 2015-09-04 MED ORDER — PHENYLEPHRINE 40 MCG/ML (10ML) SYRINGE FOR IV PUSH (FOR BLOOD PRESSURE SUPPORT): 80.0000 ug | PREFILLED_SYRINGE | INTRAVENOUS | Status: DC | PRN

## 2015-09-04 MED ORDER — ONDANSETRON HCL 4 MG/2ML IJ SOLN
4.0000 mg | Freq: Four times a day (QID) | INTRAMUSCULAR | Status: DC | PRN
  Administered 2015-09-04: 4 mg via INTRAVENOUS
  Filled 2015-09-04: qty 2

## 2015-09-04 MED ORDER — LACTATED RINGERS IV SOLN
INTRAVENOUS | Status: DC
  Administered 2015-09-04 (×3): via INTRAVENOUS

## 2015-09-04 MED ORDER — LIDOCAINE HCL (PF) 1 % IJ SOLN
INTRAMUSCULAR | Status: DC | PRN
  Administered 2015-09-04: 2 mL
  Administered 2015-09-04: 5 mL
  Administered 2015-09-04: 3 mL

## 2015-09-04 MED ORDER — TERBUTALINE SULFATE 1 MG/ML IJ SOLN
0.2500 mg | Freq: Once | INTRAMUSCULAR | Status: DC | PRN
  Filled 2015-09-04: qty 1

## 2015-09-04 MED ORDER — LACTATED RINGERS IV SOLN: 500.0000 mL | Freq: Once | INTRAVENOUS | Status: DC

## 2015-09-04 MED ORDER — PHENYLEPHRINE 40 MCG/ML (10ML) SYRINGE FOR IV PUSH (FOR BLOOD PRESSURE SUPPORT)
80.0000 ug | PREFILLED_SYRINGE | INTRAVENOUS | Status: DC | PRN
  Filled 2015-09-04: qty 10
  Filled 2015-09-04: qty 5

## 2015-09-04 MED ORDER — PENICILLIN G POTASSIUM 5000000 UNITS IJ SOLR
2.5000 10*6.[IU] | INTRAMUSCULAR | Status: DC
  Administered 2015-09-04 (×3): 2.5 10*6.[IU] via INTRAVENOUS
  Filled 2015-09-04 (×10): qty 2.5

## 2015-09-04 MED ORDER — LACTATED RINGERS IV SOLN: 500.0000 mL | INTRAVENOUS | Status: DC | PRN

## 2015-09-04 MED ORDER — PENICILLIN G POTASSIUM 5000000 UNITS IJ SOLR
5.0000 10*6.[IU] | Freq: Once | INTRAVENOUS | Status: AC
  Administered 2015-09-04: 5 10*6.[IU] via INTRAVENOUS
  Filled 2015-09-04: qty 5

## 2015-09-04 MED ORDER — LACTATED RINGERS IV SOLN
500.0000 mL | Freq: Once | INTRAVENOUS | Status: AC
  Administered 2015-09-04: 15:00:00 via INTRAVENOUS

## 2015-09-04 MED ORDER — OXYTOCIN 40 UNITS IN LACTATED RINGERS INFUSION - SIMPLE MED
1.0000 m[IU]/min | INTRAVENOUS | Status: DC
  Administered 2015-09-04: 1 m[IU]/min via INTRAVENOUS

## 2015-09-04 NOTE — Progress Notes (Signed)
Pt c/o itching from "gel" used in vaginal area. Last exam at 0457. Pt denies latex allergy. Given washcloth to clean gel. Will monitor closely.

## 2015-09-04 NOTE — MAU Note (Signed)
Pt c/o contractions since around midnight-now 2-3 mins. Denies LOF or vag bleeding. +FM. SVE 2cm on 09/01/2016.

## 2015-09-04 NOTE — Anesthesia Procedure Notes (Signed)
Epidural Patient location during procedure: OB  Staffing Anesthesiologist: Marcene DuosFITZGERALD, Donetta Isaza Performed by: anesthesiologist   Preanesthetic Checklist Completed: patient identified, site marked, surgical consent, pre-op evaluation, timeout performed, IV checked, risks and benefits discussed and monitors and equipment checked  Epidural Patient position: sitting Prep: site prepped and draped and DuraPrep Patient monitoring: continuous pulse ox and blood pressure Approach: midline Location: L4-L5 Injection technique: LOR air and LOR saline  Needle:  Needle type: Tuohy  Needle gauge: 17 G Needle length: 9 cm and 9 Needle insertion depth: 8 cm Catheter type: closed end flexible Catheter size: 19 Gauge Catheter at skin depth: 13 cm Test dose: negative  Assessment Events: blood not aspirated, injection not painful, no injection resistance, negative IV test and no paresthesia

## 2015-09-04 NOTE — Progress Notes (Signed)
Pt denies itching or discomfort.

## 2015-09-04 NOTE — Anesthesia Preprocedure Evaluation (Signed)
Anesthesia Evaluation  Patient identified by MRN, date of birth, ID band Patient awake    Reviewed: Allergy & Precautions, Patient's Chart, lab work & pertinent test results  Airway Mallampati: II  TM Distance: >3 FB Neck ROM: Full    Dental   Pulmonary former smoker,    breath sounds clear to auscultation       Cardiovascular negative cardio ROS   Rhythm:Regular Rate:Normal     Neuro/Psych negative neurological ROS     GI/Hepatic negative GI ROS, Neg liver ROS,   Endo/Other  negative endocrine ROS  Renal/GU negative Renal ROS     Musculoskeletal   Abdominal   Peds  Hematology negative hematology ROS (+)   Anesthesia Other Findings   Reproductive/Obstetrics (+) Pregnancy (Previous c-section)                             Lab Results  Component Value Date   WBC 11.8* 09/04/2015   HGB 11.8* 09/04/2015   HCT 34.5* 09/04/2015   MCV 75.8* 09/04/2015   PLT 157 09/04/2015    Anesthesia Physical Anesthesia Plan  ASA: III  Anesthesia Plan: Epidural   Post-op Pain Management:    Induction:   Airway Management Planned: Natural Airway  Additional Equipment:   Intra-op Plan:   Post-operative Plan:   Informed Consent: I have reviewed the patients History and Physical, chart, labs and discussed the procedure including the risks, benefits and alternatives for the proposed anesthesia with the patient or authorized representative who has indicated his/her understanding and acceptance.     Plan Discussed with:   Anesthesia Plan Comments:         Anesthesia Quick Evaluation

## 2015-09-05 ENCOUNTER — Encounter (HOSPITAL_COMMUNITY): Payer: Self-pay | Admitting: *Deleted

## 2015-09-05 DIAGNOSIS — Z3490 Encounter for supervision of normal pregnancy, unspecified, unspecified trimester: Secondary | ICD-10-CM

## 2015-09-05 LAB — CBC
HEMATOCRIT: 29.8 % — AB (ref 36.0–46.0)
HEMOGLOBIN: 10.2 g/dL — AB (ref 12.0–15.0)
MCH: 25.9 pg — ABNORMAL LOW (ref 26.0–34.0)
MCHC: 34.2 g/dL (ref 30.0–36.0)
MCV: 75.6 fL — ABNORMAL LOW (ref 78.0–100.0)
Platelets: 142 10*3/uL — ABNORMAL LOW (ref 150–400)
RBC: 3.94 MIL/uL (ref 3.87–5.11)
RDW: 16.1 % — AB (ref 11.5–15.5)
WBC: 16.8 10*3/uL — AB (ref 4.0–10.5)

## 2015-09-05 MED ORDER — OXYCODONE-ACETAMINOPHEN 5-325 MG PO TABS
1.0000 | ORAL_TABLET | ORAL | Status: DC | PRN
  Administered 2015-09-05 (×2): 1 via ORAL
  Administered 2015-09-05 – 2015-09-06 (×2): 2 via ORAL
  Filled 2015-09-05 (×4): qty 1
  Filled 2015-09-05: qty 2

## 2015-09-05 MED ORDER — ZOLPIDEM TARTRATE 5 MG PO TABS: 5.0000 mg | ORAL_TABLET | Freq: Every evening | ORAL | Status: DC | PRN

## 2015-09-05 MED ORDER — SENNOSIDES-DOCUSATE SODIUM 8.6-50 MG PO TABS
2.0000 | ORAL_TABLET | ORAL | Status: DC
  Administered 2015-09-05: 2 via ORAL
  Filled 2015-09-05: qty 2

## 2015-09-05 MED ORDER — COCONUT OIL OIL: 1.0000 "application " | TOPICAL_OIL | Status: DC | PRN

## 2015-09-05 MED ORDER — TETANUS-DIPHTH-ACELL PERTUSSIS 5-2.5-18.5 LF-MCG/0.5 IM SUSP: 0.5000 mL | Freq: Once | INTRAMUSCULAR | Status: DC

## 2015-09-05 MED ORDER — SIMETHICONE 80 MG PO CHEW
80.0000 mg | CHEWABLE_TABLET | ORAL | Status: DC | PRN
  Administered 2015-09-05: 80 mg via ORAL
  Filled 2015-09-05: qty 1

## 2015-09-05 MED ORDER — MEASLES, MUMPS & RUBELLA VAC ~~LOC~~ INJ
0.5000 mL | INJECTION | Freq: Once | SUBCUTANEOUS | Status: DC
  Filled 2015-09-05: qty 0.5

## 2015-09-05 MED ORDER — IBUPROFEN 600 MG PO TABS
600.0000 mg | ORAL_TABLET | Freq: Four times a day (QID) | ORAL | Status: DC
  Administered 2015-09-05 – 2015-09-06 (×7): 600 mg via ORAL
  Filled 2015-09-05 (×7): qty 1

## 2015-09-05 MED ORDER — ACETAMINOPHEN 325 MG PO TABS
650.0000 mg | ORAL_TABLET | ORAL | Status: DC | PRN
Start: 2015-09-05 — End: 2015-09-06
  Administered 2015-09-05: 650 mg via ORAL
  Filled 2015-09-05: qty 2

## 2015-09-05 MED ORDER — DIBUCAINE 1 % RE OINT
1.0000 "application " | TOPICAL_OINTMENT | RECTAL | Status: DC | PRN
  Administered 2015-09-05: 1 via RECTAL
  Filled 2015-09-05: qty 28

## 2015-09-05 MED ORDER — ONDANSETRON HCL 4 MG/2ML IJ SOLN: 4.0000 mg | INTRAMUSCULAR | Status: DC | PRN

## 2015-09-05 MED ORDER — BENZOCAINE-MENTHOL 20-0.5 % EX AERO
1.0000 "application " | INHALATION_SPRAY | CUTANEOUS | Status: DC | PRN
  Administered 2015-09-05 (×2): 1 via TOPICAL
  Filled 2015-09-05 (×2): qty 56

## 2015-09-05 MED ORDER — ONDANSETRON HCL 4 MG PO TABS: 4.0000 mg | ORAL_TABLET | ORAL | Status: DC | PRN

## 2015-09-05 MED ORDER — WITCH HAZEL-GLYCERIN EX PADS
1.0000 "application " | MEDICATED_PAD | CUTANEOUS | Status: DC | PRN
  Administered 2015-09-05: 1 via TOPICAL

## 2015-09-05 NOTE — Anesthesia Postprocedure Evaluation (Signed)
Anesthesia Post Note  Patient: Christina FrederickLarissa Shimone Olsen  Procedure(s) Performed: * No procedures listed *  Patient location during evaluation: Mother Baby Anesthesia Type: Epidural Level of consciousness: awake and alert Pain management: satisfactory to patient Vital Signs Assessment: post-procedure vital signs reviewed and stable Respiratory status: respiratory function stable Cardiovascular status: stable Postop Assessment: no headache, no backache, epidural receding, patient able to bend at knees, no signs of nausea or vomiting and adequate PO intake Anesthetic complications: no Comments: Comfort level was assessed by AnesthesiaTeam and the patient was pleased with the care, interventions, and services provided by the Department of Anesthesia.     Last Vitals:  Filed Vitals:   09/05/15 0400 09/05/15 0807  BP: 144/69 121/65  Pulse: 61 63  Temp: 36.7 C 36.8 C  Resp: 18 16    Last Pain:  Filed Vitals:   09/05/15 0902  PainSc: 7    Pain Goal: Patients Stated Pain Goal: 10 (09/04/15 0914)               Karleen DolphinFUSSELL,Myana Schlup

## 2015-09-05 NOTE — Progress Notes (Signed)
POD#1 Pt without complaints. Lochia-mild VSSAF IMP/ stable Plan/ Routine care

## 2015-09-05 NOTE — H&P (Signed)
Pt is a 25 y/o black female, G5P1031 at term who was admitted by Dr. Chestine Sporelark with contractions. She is GBS +. PNC was uncomplicated. She has a h.o. A C/S and wants a TOLAC.  PMHX: See PNR> PE: HEENT-wnl        ABD-wnl        Exts-wnl        FHTs-wnl IMP/ IUP at term         Desires TOLAC. Plan/Admit

## 2015-09-05 NOTE — Lactation Note (Signed)
This note was copied from a baby's chart. Lactation Consultation Note  P2, Ex BF 17 months.  Room full of visitors. Mother states she is going back to school in the fall. Discussed pumping options and encouraged her to consider pumping while at school. Mother states she knows how to hand express.  Provided mother w/ hand pump. Mom encouraged to feed baby 8-12 times/24 hours and with feeding cues.  Mom made aware of O/P services, breastfeeding support groups, community resources, and our phone # for post-discharge questions.     Patient Name: Christina Olsen Today's Date: 09/05/2015 Reason for consult: Initial assessment   Maternal Data Has patient been taught Hand Expression?: Yes Does the patient have breastfeeding experience prior to this delivery?: Yes  Feeding Feeding Type: Breast Fed Length of feed: 20 min  LATCH Score/Interventions                      Lactation Tools Discussed/Used     Consult Status Consult Status: Follow-up Date: 09/06/15 Follow-up type: In-patient    Dahlia ByesBerkelhammer, Quandarius Nill [facility] 09/05/2015, 2:33 PM

## 2015-09-06 MED ORDER — OXYCODONE-ACETAMINOPHEN 5-325 MG PO TABS: 1.0000 | ORAL_TABLET | ORAL | Status: DC | PRN

## 2015-09-06 NOTE — Lactation Note (Signed)
This note was copied from a baby's chart. Lactation Consultation Note  Patient Name: Christina Olsen [name] ZOXWR'UToday's Date: 09/06/2015 Reason for consult: Follow-up assessment   Follow up with Exp BF mom of 36 hour old infant. Infant with 7 BF for 10-30 minutes, 2 voids and 3 stools in last 24 hours. Infant weight 6 lb 4.8 oz with weight loss of 4 % since birth. LATCH Scores 7-10.  Mom denies nipple pain and reports her breasts are feeling fuller. Mom reports she has hand pump to take home.   Reviewed all BF information in Taking Care of Baby and Me Booklet. Reviewed engorgement prevention/treatment, comfort pumping and pre pumping to soften areola before feeding prn. Reviewed I/O and enc mom to maintain feeding log and take to Ped visit. Mom to call and make f/u ped appt.   Reviewed LC Brochure, mom aware of OP Services, BF Support Groups and LC phone #. Enc mom to call with questions/concerns prn. Mom without questions/concerns today.    Maternal Data Formula Feeding for Exclusion: No Does the patient have breastfeeding experience prior to this delivery?: Yes  Feeding Feeding Type: Breast Fed Length of feed: 20 min  LATCH Score/Interventions                      Lactation Tools Discussed/Used WIC Program: No Pump Review: Milk Storage   Consult Status Consult Status: Complete Follow-up type: Call as needed    Ed BlalockSharon S Hice 09/06/2015, 12:49 PM

## 2015-09-06 NOTE — Discharge Summary (Signed)
Obstetric Discharge Summary Reason for Admission: onset of labor Prenatal Procedures: ultrasound Intrapartum Procedures: spontaneous vaginal delivery Postpartum Procedures: none Complications-Operative and Postpartum: 2nd degree perineal laceration HEMOGLOBIN  Date Value Ref Range Status  09/05/2015 10.2* 12.0 - 15.0 g/dL Final   HCT  Date Value Ref Range Status  09/05/2015 29.8* 36.0 - 46.0 % Final    Physical Exam:  General: alert Lochia: appropriate Uterine Fundus: firm  Discharge Diagnoses: Term Pregnancy-delivered and TOLAC  Discharge Information: Date: 09/06/2015 Activity: pelvic rest Diet: routine Medications: PNV, Ibuprofen and Percocet Condition: stable Instructions: refer to practice specific booklet Discharge to: home Follow-up Information    Follow up with Levi AlandANDERSON,Hairo Garraway E, MD. Schedule an appointment as soon as possible for a visit in 1 month.   Specialty:  Obstetrics and Gynecology   Contact information:   [address] [place] [id number] [phone]       Newborn Data: Live born female  Birth Weight: 6 lb 9.5 oz (2990 g) APGAR: 8, 9  Home with mother.  Christina Olsen 09/06/2015, 7:03 AM

## 2015-09-10 ENCOUNTER — Inpatient Hospital Stay (HOSPITAL_COMMUNITY): Payer: Medicaid Other

## 2015-11-27 ENCOUNTER — Ambulatory Visit
Admission: EM | Admit: 2015-11-27 | Discharge: 2015-11-27 | Disposition: A | Discharge status: Home / Self Care | Payer: Medicaid Other | Attending: Family Medicine | Admitting: Family Medicine

## 2015-11-27 DIAGNOSIS — G4700 Insomnia, unspecified: Secondary | ICD-10-CM | POA: Insufficient documentation

## 2015-11-27 DIAGNOSIS — F329 Major depressive disorder, single episode, unspecified: Secondary | ICD-10-CM | POA: Insufficient documentation

## 2015-11-27 DIAGNOSIS — N898 Other specified noninflammatory disorders of vagina: Secondary | ICD-10-CM | POA: Insufficient documentation

## 2015-11-27 DIAGNOSIS — Z630 Problems in relationship with spouse or partner: Secondary | ICD-10-CM

## 2015-11-27 DIAGNOSIS — Z87891 Personal history of nicotine dependence: Secondary | ICD-10-CM | POA: Diagnosis not present

## 2015-11-27 DIAGNOSIS — F419 Anxiety disorder, unspecified: Secondary | ICD-10-CM | POA: Insufficient documentation

## 2015-11-27 DIAGNOSIS — A599 Trichomoniasis, unspecified: Secondary | ICD-10-CM | POA: Diagnosis not present

## 2015-11-27 LAB — CBC WITH DIFFERENTIAL/PLATELET
Basophils Absolute: 0 10*3/uL (ref 0–0.1)
Basophils Relative: 0 %
EOS ABS: 0.2 10*3/uL (ref 0–0.7)
Eosinophils Relative: 3 %
HEMATOCRIT: 40 % (ref 35.0–47.0)
HEMOGLOBIN: 13 g/dL (ref 12.0–16.0)
LYMPHS ABS: 1.8 10*3/uL (ref 1.0–3.6)
LYMPHS PCT: 28 %
MCH: 24.7 pg — AB (ref 26.0–34.0)
MCHC: 32.4 g/dL (ref 32.0–36.0)
MCV: 76.2 fL — AB (ref 80.0–100.0)
MONOS PCT: 6 %
Monocytes Absolute: 0.3 10*3/uL (ref 0.2–0.9)
NEUTROS ABS: 4 10*3/uL (ref 1.4–6.5)
NEUTROS PCT: 63 %
Platelets: 189 10*3/uL (ref 150–440)
RBC: 5.25 MIL/uL — ABNORMAL HIGH (ref 3.80–5.20)
RDW: 16.5 % — ABNORMAL HIGH (ref 11.5–14.5)
WBC: 6.4 10*3/uL (ref 3.6–11.0)

## 2015-11-27 LAB — URINALYSIS COMPLETE WITH MICROSCOPIC (ARMC ONLY)
Bilirubin Urine: NEGATIVE
Glucose, UA: NEGATIVE mg/dL
KETONES UR: NEGATIVE mg/dL
Nitrite: NEGATIVE
PH: 7 (ref 5.0–8.0)
PROTEIN: NEGATIVE mg/dL
Specific Gravity, Urine: 1.025 (ref 1.005–1.030)

## 2015-11-27 LAB — WET PREP, GENITAL
CLUE CELLS WET PREP: NONE SEEN
Sperm: NONE SEEN
Yeast Wet Prep HPF POC: NONE SEEN

## 2015-11-27 LAB — CHLAMYDIA/NGC RT PCR (ARMC ONLY)
CHLAMYDIA TR: NOT DETECTED
N GONORRHOEAE: NOT DETECTED

## 2015-11-27 MED ORDER — METRONIDAZOLE 500 MG PO TABS: 2000.0000 mg | ORAL_TABLET | Freq: Once | ORAL | 0 refills | Status: AC

## 2015-11-27 MED ORDER — AZITHROMYCIN 500 MG PO TABS
1000.0000 mg | ORAL_TABLET | Freq: Once | ORAL | Status: AC
  Administered 2015-11-27: 1000 mg via ORAL

## 2015-11-27 MED ORDER — CEFTRIAXONE SODIUM 250 MG IJ SOLR
250.0000 mg | Freq: Once | INTRAMUSCULAR | Status: AC
  Administered 2015-11-27: 250 mg via INTRAMUSCULAR

## 2015-11-27 MED ORDER — BUSPIRONE HCL 7.5 MG PO TABS: 7.5000 mg | ORAL_TABLET | Freq: Two times a day (BID) | ORAL | 0 refills | Status: AC

## 2015-11-27 NOTE — ED Provider Notes (Signed)
CSN: [id number]     Arrival date & time 11/27/15  1250 History   First MD Initiated Contact with Patient 11/27/15 1405     Chief Complaint  Patient presents with  . Vaginal Discharge  . Depression   (Consider location/radiation/quality/duration/timing/severity/associated sxs/prior Treatment) African [place] female here for evaluation vaginal discharge and feeling sad/anxious trouble sleeping recently separated from her significant other has two children with him 27 year old and 963 month old. Vaginal delivery with 2nd degree tear.  Last sexual intercourse a couple days before delivery.  Wearing pantiliners still due to discharge doesn't feel like BV or yeast to patient worried she might have infection from tear during delivery.  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  Has been teary.  Sad relationship ending as significant other her best friend also hasn't talked to him in over a week.  States has been on alprazolam in the past 0.5mg .  Currently breastfeeding but thinking of stopping child has gotten formula the past week due to her decreased milk production. 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  States children sleep all night 2100-0700.  She has been having problems falling asleep usually midnight to 0200 falling asleep and then up 0600.  Denied fever/chills/nausea/vomiting/abdomen pain/vaginal bleeding.  Hasn't had menses since prior to pregnancy.  PMHx  Anxiety  PSHx c-section 2014  FHx M-bipolar on lithium      Past Medical History:  Diagnosis Date  . Chlamydia   . Trichimoniasis   . Urinary tract infection    Past Surgical History:  Procedure Laterality Date  . CESAREAN SECTION N/A 11/02/2012   Procedure: CESAREAN SECTION-Baby boy @2327  Apgar 9/9;  Surgeon: Michael LitterNaima A Dillard, MD;  Location: [facility];  Service: Obstetrics;  Laterality: N/A;  . DILATION AND EVACUATION N/A 09/10/2014    Procedure: DILATATION AND EVACUATION;  Surgeon: Levie HeritageJacob J Stinson, DO;  Location: [facility];  Service: Gynecology;  Laterality: N/A;  . FRACTURE SURGERY  1999   right collarbone  . WISDOM TOOTH EXTRACTION     Family History  Problem Relation Age of Onset  . Hypertension Mother   . Bipolar disorder Mother   . Hypertension Maternal Aunt   . Bipolar disorder Maternal Aunt   . Schizophrenia Maternal Aunt   . OCD Maternal Aunt   . Hypertension Maternal Uncle   . Hypertension Maternal Grandmother    Social History  Substance Use Topics  . Smoking status: Former Games developermoker  . Smokeless tobacco: Never Used     Comment: black and mild  . Alcohol use No     Comment: occasional mixed drinks    OB History    Gravida Para Term Preterm AB Living   5 2 2   3 2    SAB TAB Ectopic Multiple Live Births   3     0 2     Review of Systems  Constitutional: Negative for activity change, appetite change, chills, diaphoresis, fatigue and fever.  HENT: Negative for congestion, ear pain and sore throat.   Eyes: Negative for photophobia, pain, discharge, redness, itching and visual disturbance.  Respiratory: Negative for cough, choking, chest tightness, shortness of breath, wheezing and stridor.   Cardiovascular: Negative for chest pain, palpitations and leg swelling.  Gastrointestinal: Negative for abdominal distention, abdominal pain, blood in stool, constipation, diarrhea, nausea and vomiting.  Endocrine: Negative for polydipsia, polyphagia and polyuria.  Genitourinary: Positive  for vaginal discharge. Negative for decreased urine volume, difficulty urinating, dyspareunia, dysuria, enuresis, flank pain, frequency, hematuria, menstrual problem, pelvic pain, urgency, vaginal bleeding and vaginal pain.  Musculoskeletal: Positive for back pain. Negative for arthralgias, gait problem, joint swelling, myalgias, neck pain and neck stiffness.  Skin: Negative for color change, pallor, rash and wound.   Allergic/Immunologic: Positive for food allergies. Negative for environmental allergies.  Neurological: Negative for dizziness, tremors, seizures, syncope, facial asymmetry, speech difficulty, weakness, light-headedness, numbness and headaches.  Hematological: Negative for adenopathy. Does not bruise/bleed easily.  Psychiatric/Behavioral: Positive for sleep disturbance. Negative for agitation, behavioral problems, confusion, decreased concentration, dysphoric mood, hallucinations, self-injury and suicidal ideas. The patient is nervous/anxious. The patient is not hyperactive.   All other systems reviewed and are negative.   Allergies  Shellfish-derived products and Sulfa antibiotics  Home Medications   Prior to Admission medications   Medication Sig Start Date End Date Taking? Authorizing Provider  Multiple Vitamins-Minerals (MULTIVITAMIN WITH MINERALS) tablet Take 1 tablet by mouth daily.   Yes Historical Provider, MD  busPIRone (BUSPAR) 7.5 MG tablet Take 1 tablet (7.5 mg total) by mouth 2 (two) times daily. 11/27/15 12/27/15  Barbaraann Barthel, NP  metroNIDAZOLE (FLAGYL) 500 MG tablet Take 4 tablets (2,000 mg total) by mouth once. 11/27/15 11/27/15  Barbaraann Barthel, NP   Meds Ordered and Administered this Visit   Medications  azithromycin (ZITHROMAX) tablet 1,000 mg (1,000 mg Oral Given 11/27/15 1547)  cefTRIAXone (ROCEPHIN) injection 250 mg (250 mg Intramuscular Given 11/27/15 1547)    BP (!) 142/101 (BP Location: Left Arm)   Pulse (!) 58   Temp 98.6 F (37 C) (Oral)   Resp 16   Ht 5\' 6"  (1.676 m)   Wt 195 lb (88.5 kg)   SpO2 99%   Breastfeeding? Yes   BMI 31.47 kg/m  No data found.   Physical Exam  Constitutional: She is oriented to person, place, and time. She appears well-developed and well-nourished. She is active and cooperative.  Non-toxic appearance. She does not have a sickly appearance. She does not appear ill. No distress.  HENT:  Head: Normocephalic and  atraumatic.  Right Ear: Hearing and external ear normal.  Left Ear: Hearing and external ear normal.  Nose: Nose normal. Right sinus exhibits no maxillary sinus tenderness and no frontal sinus tenderness. Left sinus exhibits no maxillary sinus tenderness and no frontal sinus tenderness.  Mouth/Throat: Uvula is midline, oropharynx is clear and moist and mucous membranes are normal. She does not have dentures. No oral lesions. No trismus in the jaw. Normal dentition. No dental abscesses, uvula swelling, lacerations or dental caries. No oropharyngeal exudate, posterior oropharyngeal edema, posterior oropharyngeal erythema or tonsillar abscesses. No tonsillar exudate.  Eyes: Conjunctivae, EOM and lids are normal. Pupils are equal, round, and reactive to light. Right eye exhibits no discharge. Left eye exhibits no discharge. No scleral icterus.  Neck: Normal range of motion. Neck supple. No tracheal deviation present. No thyromegaly present.  Cardiovascular: Normal rate, regular rhythm, normal heart sounds and intact distal pulses.  Exam reveals no gallop and no friction rub.   No murmur heard. Pulmonary/Chest: Effort normal and breath sounds normal. No stridor. No respiratory distress. She has no decreased breath sounds. She has no wheezes. She has no rhonchi. She has no rales.  Abdominal: Soft. Normal appearance and bowel sounds are normal. She exhibits no shifting dullness, no distension, no pulsatile liver, no fluid wave, no abdominal bruit, no ascites, no pulsatile midline mass  and no mass. There is no hepatosplenomegaly. There is no tenderness. There is no rigidity, no rebound, no guarding, no CVA tenderness, no tenderness at McBurney's point and negative Murphy's sign. No hernia. Hernia confirmed negative in the ventral area.  Dull to percussion x 4 quads  Musculoskeletal: She exhibits no edema, tenderness or deformity.       Right shoulder: Normal.       Left shoulder: Normal.       Right elbow:  Normal.      Left elbow: Normal.       Right hip: Normal.       Left hip: Normal.       Right knee: Normal.       Left knee: Normal.       Right ankle: Normal.       Left ankle: Normal.       Cervical back: Normal.       Thoracic back: Normal.       Lumbar back: She exhibits pain. She exhibits normal range of motion, no tenderness, no bony tenderness, no swelling, no edema, no deformity, no laceration, no spasm and normal pulse.       Right hand: Normal.       Left hand: Normal.  Lymphadenopathy:       Head (right side): No submental, no submandibular, no tonsillar, no preauricular, no posterior auricular and no occipital adenopathy present.       Head (left side): No submental, no submandibular, no tonsillar, no preauricular, no posterior auricular and no occipital adenopathy present.    She has no cervical adenopathy.       Right cervical: No superficial cervical, no deep cervical and no posterior cervical adenopathy present.      Left cervical: No superficial cervical, no deep cervical and no posterior cervical adenopathy present.  Neurological: She is alert and oriented to person, place, and time. She has normal strength. She is not disoriented. She displays no tremor. No cranial nerve deficit or sensory deficit. She exhibits normal muscle tone. She displays no seizure activity. Coordination and gait normal. GCS eye subscore is 4. GCS verbal subscore is 5. GCS motor subscore is 6.  Skin: Skin is warm, dry and intact. Capillary refill takes less than 2 seconds. No rash noted. She is not diaphoretic. No cyanosis or erythema. No pallor. Nails show no clubbing.  Psychiatric: She has a normal mood and affect. Her speech is normal and behavior is normal. Judgment and thought content normal. She is not actively hallucinating. Cognition and memory are normal. She is attentive.  Nursing note and vitals reviewed.  GAD 7 not at all 0; several days 1; more than half the days 2; nearly every day 3  over the last 2 weeks  Feeling nervous, anxious or on edge 0 Not being able to stop or control worrying 1 Worrying too much about different things 1 Trouble relaxing 1 Being so restlness that it is hard to sit still 0 Becoming easily annoyed or irritable 1 Feeling afraid as if something awful might happen 0 Score 4 If you check off any problems, how difficult have these problems made it for you to do your work, take care of things at home, or get along with other peope? somewhat difficult 5 to 9 mild 10 to 14 moderate 15 to 21 severe anxiety  PHQ-9 Not at all 0; several days 1; more than half the days 2; nearly every day 3 over the last 2 weeks  little interest or pleasure in doing things 1 Feeling down,depressed or hopeless 1 Trouble falling or staying asleep or sleeping too much 1 Feeling tired or having little energy 1 Poor appetite or overeating 0 Feeling bad about yourself or that you are a failure or have let yourself or your family down 1 Trouble concentrating on things, such as reading the newspaper or watching TV 0 Moving or speaking so slowly that other people could have noticed?  Or the opposite, being so fidgety or restless that you have been moving around a lot more than usual 0 Thoughts that you would be better off dead or hurting yourself in some way 0 Score 5 5 to 9 mild; 10 to 14 moderate; 15 to 19 moderately severe  >20 severe If you check off any problems, how difficult have these problems made it for you to do your work, take care of things at home, or get along with other peope?  Somewhat difficult Urgent Care Course   Clinical Course    Procedures (including critical care time)  Labs Review Labs Reviewed  WET PREP, GENITAL - Abnormal; Notable for the following:       Result Value   Trich, Wet Prep PRESENT (*)    WBC, Wet Prep HPF POC TOO NUMEROUS TO COUNT (*)    All other components within normal limits  URINALYSIS COMPLETEWITH MICROSCOPIC ([facility] ONLY) -  Abnormal; Notable for the following:    Color, Urine YELLOW (*)    APPearance CLOUDY (*)    Hgb urine dipstick TRACE (*)    Leukocytes, UA MODERATE (*)    Bacteria, UA FEW (*)    Squamous Epithelial / LPF 6-30 (*)    All other components within normal limits  CBC WITH DIFFERENTIAL/PLATELET - Abnormal; Notable for the following:    RBC 5.25 (*)    MCV 76.2 (*)    MCH 24.7 (*)    RDW 16.5 (*)    All other components within normal limits  CHLAMYDIA/NGC RT PCR ([facility] ONLY)  HIV ANTIBODY (ROUTINE TESTING)  RPR  HSV 2 ANTIBODY, IGG    Imaging Review No results found.   Patient self swabbed for wet prep.  Pending urinalysis and CBC and pregnancy test.  PHQ-9 and GAD-7 completed and discussed with patient. Mild symptoms/score.  Discussed exercise daily, keep regular schedule, healthy eating  Sleep hygiene discussed with patient.  Typically mild symptoms treated with coping mechanisms.  Patient reported she needs medication.  Discussed I do not typically benzodiazepines but would consider other options.  Patient agreed to establish care with new PCM and follow up within 2 weeks with new PCM or urgent care provider for re-evaluation. ER if thoughts of HI/SI sooner.  Patient verbalized understanding information/instructions agreed with plan of care and had no further questions at this time.  1500 reviewed wet prep and CBC results with patient.  Discussed to pump and dump breastmilk for 24 hours after taking metronidazole 2000mg  po x 1 for trichomonas.normal WBC and no anemia. Patient reported  Increased anxiety with notification of these results.  Concern her baby could have infection now also.  Discussed with patient to notify pediatrician.  Child had normal well child visit and vaccinations last week per patient.  Reassured patient.  Refused further STI testing e.g. HIV, herpes, syphillis, gonorrhea, chlamydia.  She insists on having medication to take home for anxiety will start buspar  7.5mg  po BID and follow up with PCM or [facility] provider in 2 weeks.  Patient verbalized understanding information/instructions, agreed with plan of care and had no further questions at this time.  1530 patient requested further STD testing after initial refusal. Patient also wants to be treated for gonorrhea and chlamydia today prior to leaving clinic.  Rocephin 250mg  IM x 1 and azithromycin 1000mg  po x1 administered by RN Roosevelt Locks.   HIV and syphillis serum samples drawn by RN Christoper Fabian.  Discussed with patient will call her when results available typically 72 hours.  Patient verbalized understanding information/instructions, agreed with plan of care and had no further questions at this time.  1531 Reviewed [place] Controlled substance reporting system in past year 2 Rx for oxycodone s/p delivery vaginal total 50 pills noted.   MDM   1. Relationship problem between partners   2. Trichomoniasis   3. Insomnia    Primarily at night resulting in insomnia.  Will try buspar 7.5mg  po BID and follow up with PCM in 1-2 weeks.   Patient stated anxiety does not cause problems at work and able to complete all duties.  Discussed self-referral to chaplain to discuss issues that she worries about.  Try writing things down in notebook as part of bedtime routine and try shower/bath, warm drink, regular bedtime schedule to improve her sleep hygiene.  Exercise during the day not within 2 hours of bedtime.  Discussed sleep hygiene, maintaining routine.  No S/I or H/I, plan, or ideation.  Avoid screen time e.g. Phone/computer/TV 2 hours prior to bedtime.  Discussed other resources patient may use if symptoms worsen EMERGENCY ROOM, chaplain, PCM on call or Urgent Care Center.  Exitcare handout on insomnia and anxiety given to patient.   Return to the clinic if any new or worsening symptoms.  Patient verbalized understanding of information/instructions, agreed with plan of care and had no further questions at this time. P2:   Diet and Exercise.  Stress reduction.  Notified patients results will be available in 3-4 days.  RTC if worsening symptoms or new symptoms. + trichomonas today handout given.  Metronidazole 2000mg  po x 1 Rx given.   Encouraged patient to use condoms for sexual encounters since she is not in a monogamous relationship.  Discussed not all sexually transmitted diseases are curable e.g. herpes, HPV, HIV and that antibiotic resistant gonorrhea and syphilis in [place]. Patient verbalized understanding and agreed with plan of care and had no further questions at this time.        Barbaraann Barthel, NP 11/27/15 2239

## 2015-11-27 NOTE — ED Triage Notes (Signed)
Patient has elevated B/P at this visit

## 2015-11-27 NOTE — ED Triage Notes (Signed)
Patient complains of vaginal discharge. Patient states that she had a baby on 09/04/2015 at [facility] and reports a 2nd degree tear. Patient states that she has yellow discharge but, reports no sexual activity since having a baby. Patient reports no odor, but some itchy. Patient reports that she is still having to wear a panty liner and states that she has still not had a period since having a baby. Patient states that she is currently breastfeeding.

## 2015-11-28 LAB — HSV 2 ANTIBODY, IGG: HSV 2 Glycoprotein G Ab, IgG: <0.91 index (ref 0.00–0.90)

## 2015-11-28 LAB — RPR: RPR Ser Ql: NONREACTIVE

## 2015-11-28 LAB — HIV ANTIBODY (ROUTINE TESTING W REFLEX): HIV Screen 4th Generation wRfx: NONREACTIVE

## 2015-11-30 ENCOUNTER — Telehealth: Payer: Self-pay | Admitting: General Practice

## 2015-11-30 NOTE — Telephone Encounter (Signed)
Telephone message left for patient HIV, gonorrhea, chlamydia, syphillis negative.  Requested she call nurse if she wanted to discuss results in more detail and to give update on how she is doing since starting her buspar.

## 2016-06-22 ENCOUNTER — Other Ambulatory Visit: Payer: Self-pay | Admitting: Registered Nurse

## 2016-07-07 ENCOUNTER — Other Ambulatory Visit: Payer: Self-pay | Admitting: Registered Nurse

## 2016-07-08 ENCOUNTER — Encounter: Payer: Self-pay | Admitting: Registered Nurse

## 2016-07-08 ENCOUNTER — Telehealth: Payer: Self-pay | Admitting: Registered Nurse

## 2016-07-08 NOTE — Telephone Encounter (Signed)
Received request to refill patient buspar from [facility].  [facility] needs follow up appt for refill.  Pharmacist notified via telephone verbalized understanding and will notify patient.

## 2016-08-13 ENCOUNTER — Ambulatory Visit
Admission: EM | Admit: 2016-08-13 | Discharge: 2016-08-13 | Disposition: A | Discharge status: Home / Self Care | Payer: Medicaid Other | Attending: Emergency Medicine | Admitting: Emergency Medicine

## 2016-08-13 DIAGNOSIS — Z79899 Other long term (current) drug therapy: Secondary | ICD-10-CM | POA: Insufficient documentation

## 2016-08-13 DIAGNOSIS — J029 Acute pharyngitis, unspecified: Secondary | ICD-10-CM | POA: Insufficient documentation

## 2016-08-13 DIAGNOSIS — Z87891 Personal history of nicotine dependence: Secondary | ICD-10-CM | POA: Insufficient documentation

## 2016-08-13 LAB — RAPID STREP SCREEN (MED CTR MEBANE ONLY): Streptococcus, Group A Screen (Direct): NEGATIVE

## 2016-08-13 MED ORDER — FLUTICASONE PROPIONATE 50 MCG/ACT NA SUSP: 2.0000 | Freq: Every day | NASAL | 0 refills | Status: DC

## 2016-08-13 MED ORDER — IBUPROFEN 600 MG PO TABS: 600.0000 mg | ORAL_TABLET | Freq: Four times a day (QID) | ORAL | 0 refills | Status: DC | PRN

## 2016-08-13 NOTE — ED Triage Notes (Signed)
Patient complains of sore throat that started yesterday. Patient has been around her niece who is positive with strep.

## 2016-08-13 NOTE — Discharge Instructions (Signed)
your rapid strep was negative today, so we have sent off a throat culture.  We will contact you and call in the appropriate antibiotics if your culture comes back positive for an infection requiring antibiotic treatment.  Give us a working phone number.  1 gram of Tylenol and 600 mg ibuprofen together 3-4 times a day as needed for pain.  Make sure you drink plenty of extra fluids.  Some people find salt water gargles and  Traditional Medicinal's "Throat Coat" tea helpful. Take 5 mL of liquid Benadryl and 5 mL of Maalox. Mix it together, and then hold it in your mouth for as long as you can and then swallow. You may do this 4 times a day.    Go to www.goodrx.com to look up your medications. This will give you a list of where you can find your prescriptions at the most affordable prices. Or ask the pharmacist what the cash price is. This can be less expensive than what you would pay with insurance.

## 2016-08-13 NOTE — ED Provider Notes (Signed)
HPI  SUBJECTIVE:  Patient reports sore throat starting last night. Sx worse with swallowing.  Sx better with Tylenol 500 mg and saltwater gargles. No fevers + Swollen neck glands    No Cough/URI sxs + Myalgias No Headache No Rash     + Recent Strep Exposure- states niece has strep and she visited her last night No Abdominal Pain No reflux sxs No Allergy sxs  No Breathing difficulty, voice changes, sensation of throat swelling shut  No Drooling No Trismus No abx in past month. No antipyretic in past 4-6 hrs Pt is a former smoker. Past medical history of strep in childhood, allergies. No history of mono, diabetes, hypertension LMP: 5/24, denies possibility of being pregnant PMD: [facility]  Past Medical History:  Diagnosis Date  . Chlamydia   . Trichimoniasis   . Urinary tract infection     Past Surgical History:  Procedure Laterality Date  . CESAREAN SECTION N/A 11/02/2012   Procedure: CESAREAN SECTION-Baby boy @2327  Apgar 9/9;  Surgeon: Michael Litter, MD;  Location: [facility];  Service: Obstetrics;  Laterality: N/A;  . DILATION AND EVACUATION N/A 09/10/2014   Procedure: DILATATION AND EVACUATION;  Surgeon: Levie Heritage, DO;  Location: [facility];  Service: Gynecology;  Laterality: N/A;  . FRACTURE SURGERY  1999   right collarbone  . WISDOM TOOTH EXTRACTION      Family History  Problem Relation Age of Onset  . Hypertension Mother   . Bipolar disorder Mother   . Hypertension Maternal Aunt   . Bipolar disorder Maternal Aunt   . Schizophrenia Maternal Aunt   . OCD Maternal Aunt   . Hypertension Maternal Uncle   . Hypertension Maternal Grandmother     Social History  Substance Use Topics  . Smoking status: Former Games developer  . Smokeless tobacco: Never Used     Comment: black and mild  . Alcohol use No     Comment: occasional mixed drinks     No current facility-administered medications for this encounter.   Current Outpatient Prescriptions:  .   fluticasone (FLONASE) 50 MCG/ACT nasal spray, Place 2 sprays into both nostrils daily., Disp: 16 g, Rfl: 0 .  ibuprofen (ADVIL,MOTRIN) 600 MG tablet, Take 1 tablet (600 mg total) by mouth every 6 (six) hours as needed., Disp: 30 tablet, Rfl: 0 .  Multiple Vitamins-Minerals (MULTIVITAMIN WITH MINERALS) tablet, Take 1 tablet by mouth daily., Disp: , Rfl:   Allergies  Allergen Reactions  . Shellfish-Derived Products Anaphylaxis and Swelling  . Sulfa Antibiotics Other (See Comments)    Reaction:  Unknown     ROS  As noted in HPI.   Physical Exam  BP 114/84 (BP Location: Left Arm)   Pulse 94   Temp 98.4 F (36.9 C) (Oral)   Resp 16   Ht 5\' 6"  (1.676 m)   Wt 185 lb (83.9 kg)   LMP 07/28/2016   SpO2 100%   Breastfeeding? No   BMI 29.86 kg/m   Constitutional: Well developed, well nourished, no acute distress Eyes:  EOMI, conjunctiva normal bilaterally HENT: Normocephalic, atraumatic,mucus membranes moist. - nasal congestion + erythematous oropharynx + slightly enlarged tonsils  - exudates. Uvula midline. Positive cobblestoning Respiratory: Normal inspiratory effort Cardiovascular: Normal rate, no murmurs, rubs, gallops GI: nondistended, nontender. No appreciable splenomegaly skin: No rash, skin intact Lymph: + shotty cervical LN  Musculoskeletal: no deformities Neurologic: Alert & oriented x 3, no focal neuro deficits Psychiatric: Speech and behavior appropriate. At baseline mental status per  caregiver.   ED Course   Medications - No data to display  Orders Placed This Encounter  Procedures  . Rapid strep screen    Standing Status:   Standing    Number of Occurrences:   1    Order Specific Question:   Patient immune status    Answer:   Normal  . Culture, group A strep    Standing Status:   Standing    Number of Occurrences:   1    Results for orders placed or performed during the hospital encounter of 08/13/16 (from the past 24 hour(s))  Rapid strep screen      Status: None   Collection Time: 08/13/16 11:44 AM  Result Value Ref Range   Streptococcus, Group A Screen (Direct) NEGATIVE NEGATIVE   No results found.  ED Clinical Impression  Sore throat  ED Assessment/Plan   Rapid strep negative. Obtaining throat culture to guide antibiotic treatment. Discussed this with patient. We'll contact them if culture is positive, and will call in Appropriate antibiotics. Patient home with ibuprofen, Tylenol, Flonase, she has allergies, and Benadryl/Maalox mixture.. Patient to followup with PMD when necessary,   Discussed labs, MDM, plan and followup with patient . Discussed sn/sx that should prompt return to the  ED. Patient  agrees with plan.  Meds ordered this encounter  Medications  . ibuprofen (ADVIL,MOTRIN) 600 MG tablet    Sig: Take 1 tablet (600 mg total) by mouth every 6 (six) hours as needed.    Dispense:  30 tablet    Refill:  0  . fluticasone (FLONASE) 50 MCG/ACT nasal spray    Sig: Place 2 sprays into both nostrils daily.    Dispense:  16 g    Refill:  0     *This clinic note was created using Scientist, clinical (histocompatibility and immunogenetics)Dragon dictation software. Therefore, there may be occasional mistakes despite careful proofreading.    Domenick GongMortenson, Endy Easterly, MD 08/13/16 1213

## 2016-08-16 LAB — CULTURE, GROUP A STREP (THRC)

## 2016-08-18 ENCOUNTER — Telehealth: Payer: Self-pay

## 2016-08-18 MED ORDER — AMOXICILLIN 500 MG PO CAPS: 500.0000 mg | ORAL_CAPSULE | Freq: Two times a day (BID) | ORAL | 0 refills | Status: DC

## 2016-08-18 NOTE — Telephone Encounter (Signed)
-----   Message from Lutricia FeilWilliam P Roemer, PA-C sent at 08/16/2016  1:15 PM EDT ----- Please contact patient and let her know of the positive strep results.     call in amoxicillin twice a day 10 days  Thanks,  [employer]

## 2016-11-03 IMAGING — US US OB COMP LESS 14 WK
1 series · 14 of 21 positions shown · non-contrast
Comparison: 08/13/2014

CLINICAL DATA: Vaginal bleeding in first-trimester pregnancy.
Patient declines transvaginal imaging.

EXAM:
OBSTETRIC <14 WK ULTRASOUND
TECHNIQUE: Transabdominal ultrasound was performed for evaluation of the
gestation as well as the maternal uterus and adnexal regions.

[Series 1: us ob transvaginal · 14 of 21 slices shown]
[im 1/21]
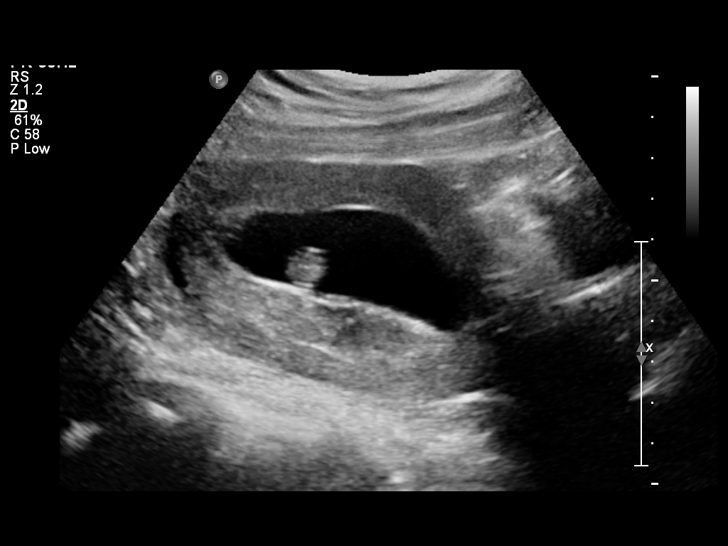
[im 3/21]
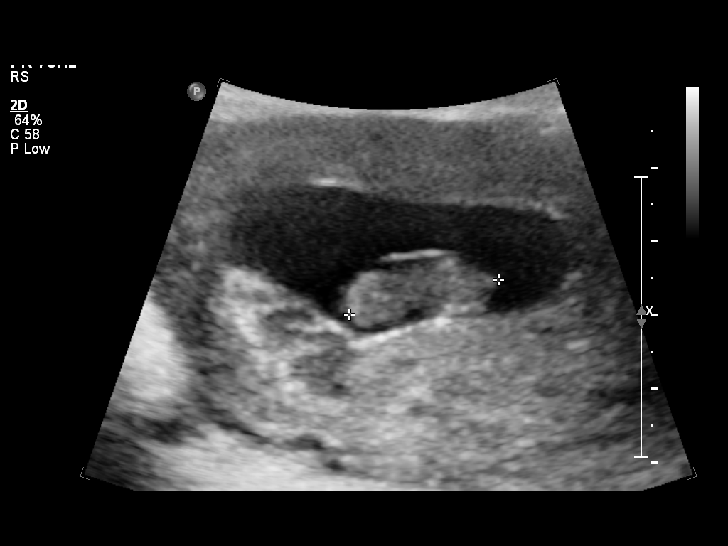
[im 4/21]
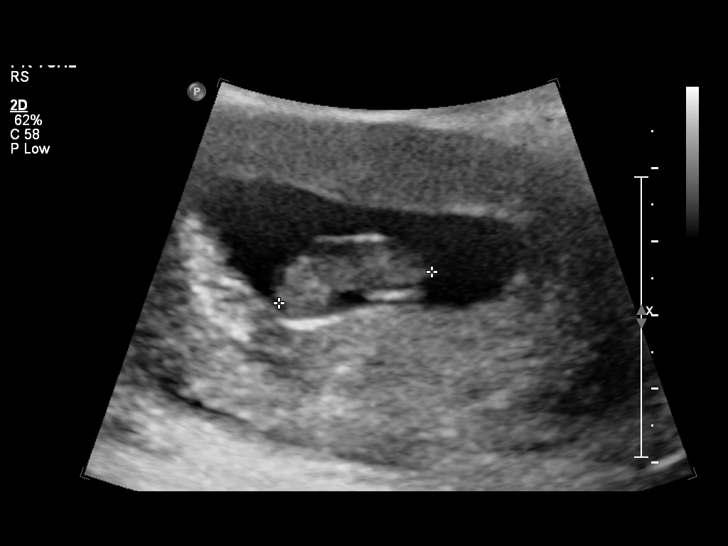
[im 6/21]
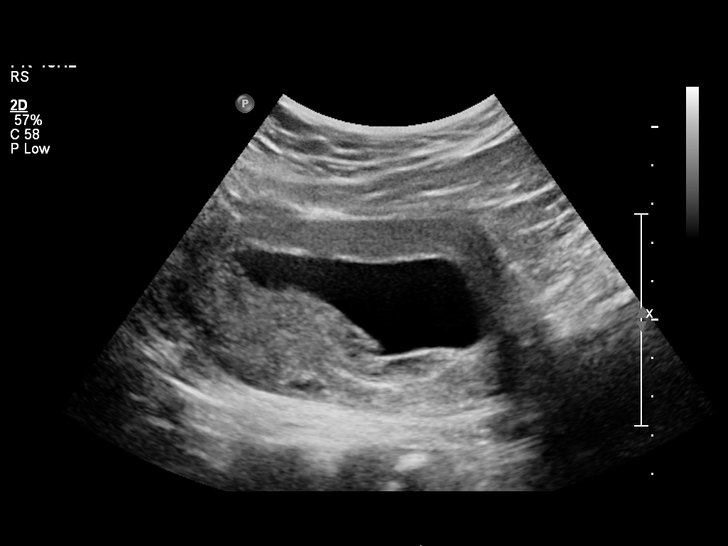
[im 7/21]
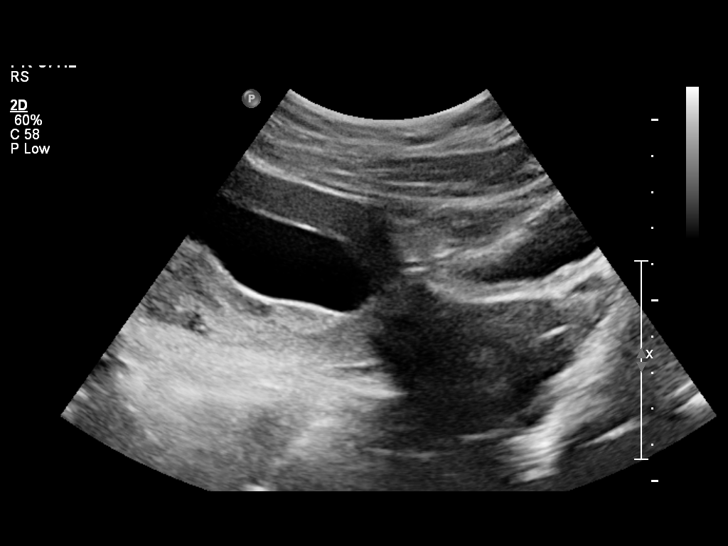
[im 9/21]
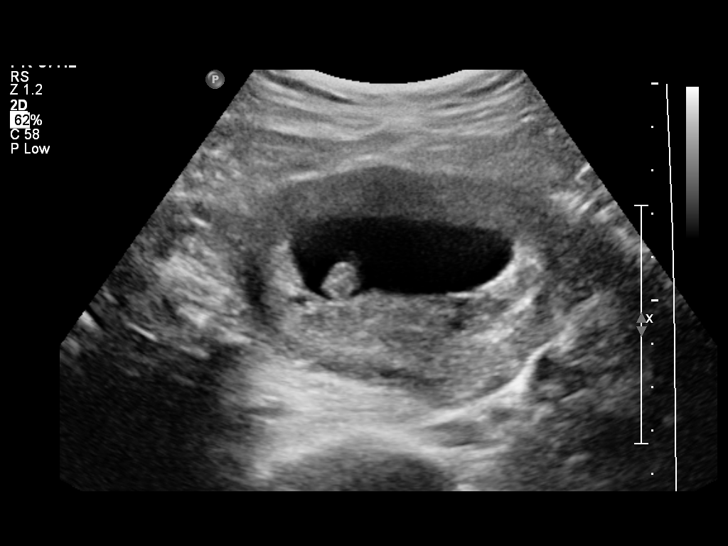
[im 10/21]
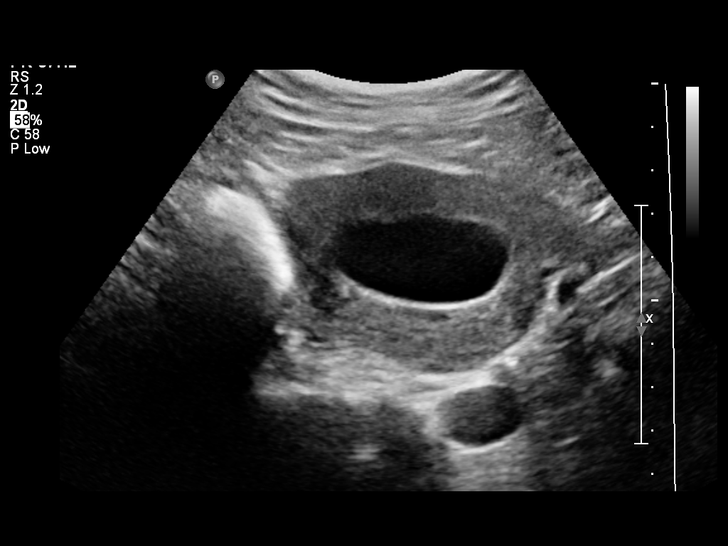
[im 12/21]
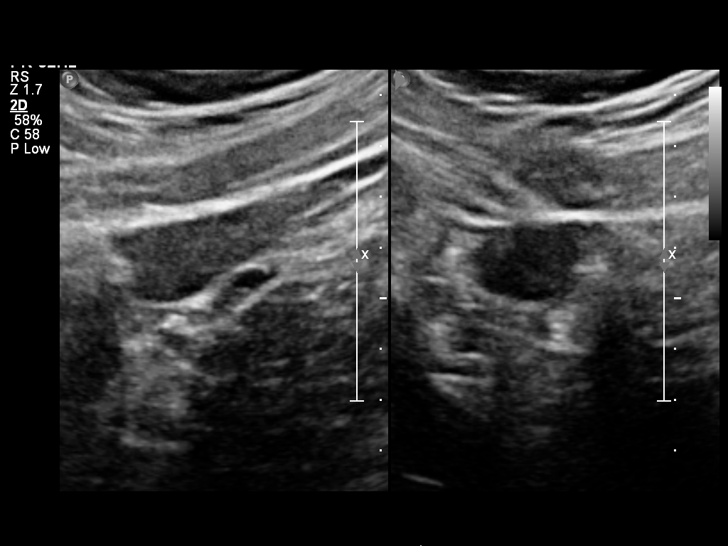
[im 13/21]
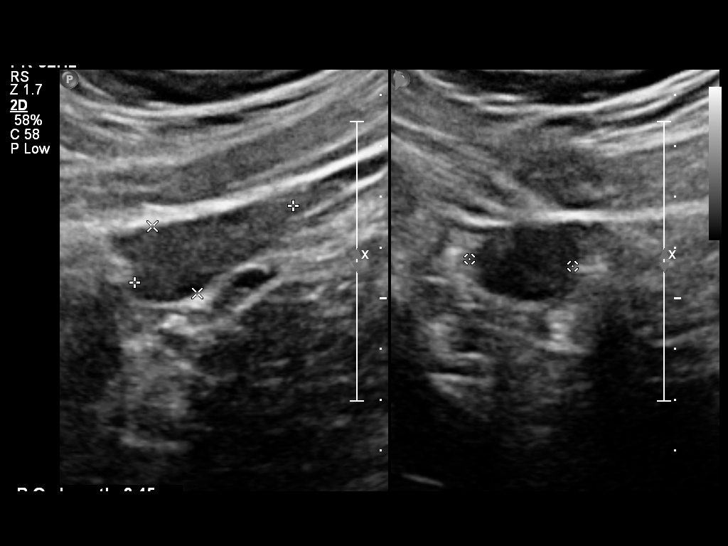
[im 15/21]
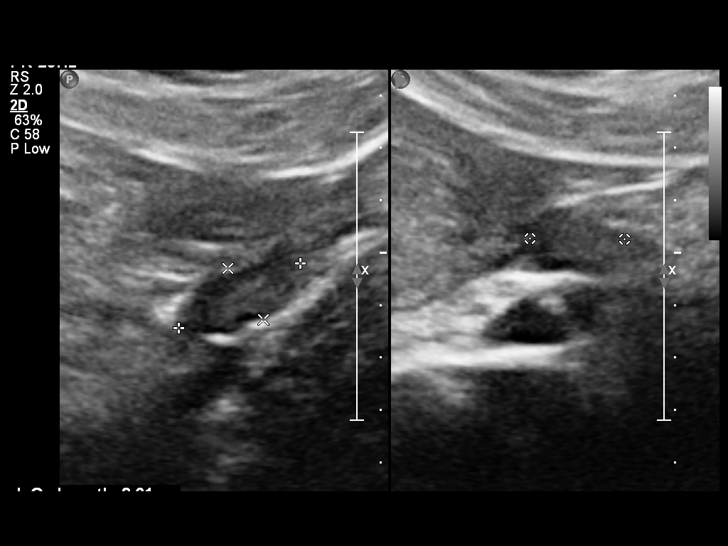
[im 16/21]
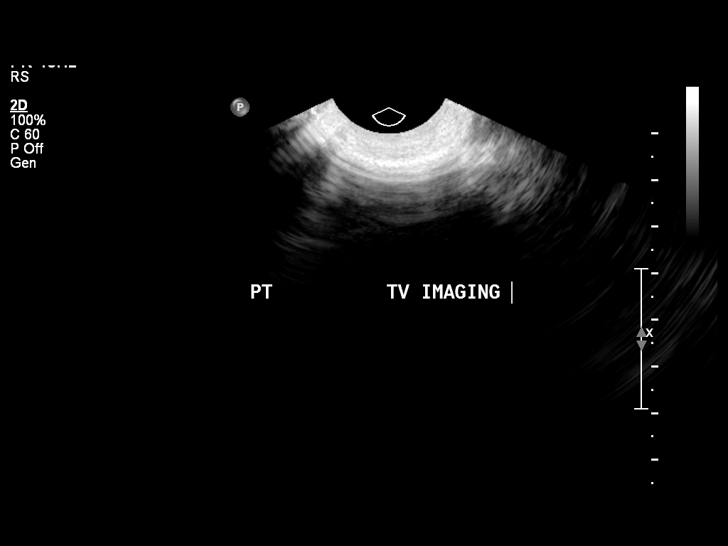
[im 18/21]
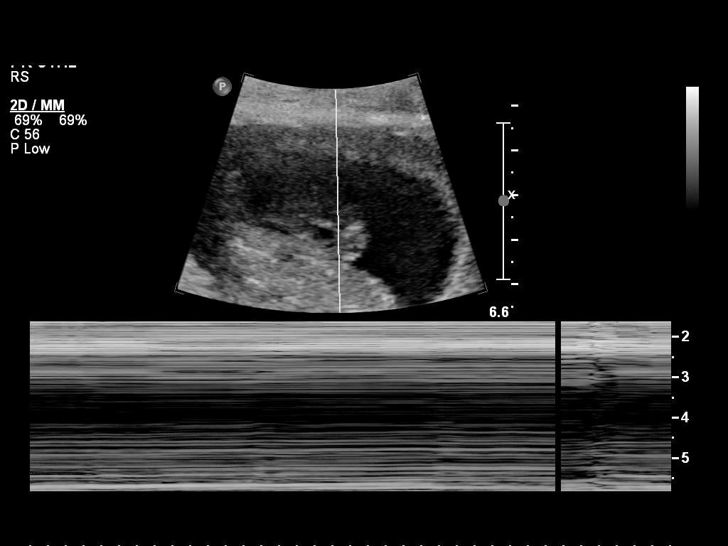
[im 19/21]
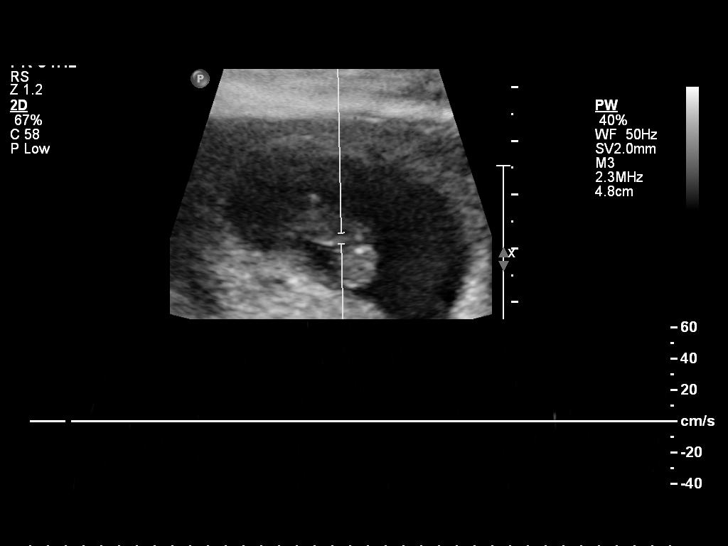
[im 21/21]
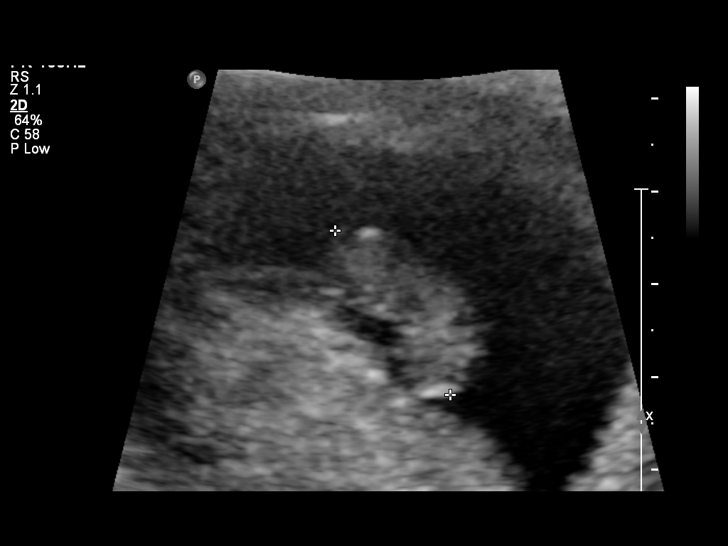

[14 of 21 positions shown; findings below may reference images not displayed]

FINDINGS: Intrauterine gestational sac: Visualized.  No subchorionic hematoma.

Yolk sac:  Not seen

Embryo:  Present

Cardiac Activity: Not visible by power Doppler or motion Doppler.

CRL: 21.2 mm 8 w 6 d. There has been no expected growth of the
embryo since previous.

Maternal uterus/adnexae: No pathologic findings. Both ovaries appear
normal. No free pelvic fluid.
IMPRESSION: Single intrauterine gestation with no cardiac activity or growth
since 08/13/2014. Findings meet definitive criteria for failed
pregnancy. This follows SRU consensus guidelines: Diagnostic
Criteria for Nonviable Pregnancy Early in the First Trimester. N
Engl J Med 4187;[DATE].

## 2018-05-04 ENCOUNTER — Ambulatory Visit
Admission: EM | Admit: 2018-05-04 | Discharge: 2018-05-04 | Disposition: A | Discharge status: Home / Self Care | Payer: BLUE CROSS/BLUE SHIELD | Attending: Family Medicine | Admitting: Family Medicine

## 2018-05-04 ENCOUNTER — Encounter: Payer: Self-pay | Admitting: Emergency Medicine

## 2018-05-04 ENCOUNTER — Other Ambulatory Visit: Payer: Self-pay

## 2018-05-04 DIAGNOSIS — N764 Abscess of vulva: Secondary | ICD-10-CM

## 2018-05-04 HISTORY — DX: Hidradenitis suppurativa: L73.2

## 2018-05-04 MED ORDER — KETOROLAC TROMETHAMINE 10 MG PO TABS: 10.0000 mg | ORAL_TABLET | Freq: Three times a day (TID) | ORAL | 0 refills | Status: DC | PRN

## 2018-05-04 MED ORDER — HYDROCODONE-ACETAMINOPHEN 5-325 MG PO TABS: ORAL_TABLET | ORAL | 0 refills | Status: DC

## 2018-05-04 NOTE — ED Triage Notes (Signed)
Patient in today with an abscess of her vaginal area x 2-3 days.  Patient saw PCP and referred to GYN, but appointment not until 05/08/18. They advised patient to come to [facility] to see if there was anything we could do. Patient was given a prescription for Clindamycin.

## 2018-05-04 NOTE — ED Provider Notes (Signed)
[facility]    CSN: [id number] Arrival date & time: 05/04/18  1235     History   Chief Complaint Chief Complaint  Patient presents with  . Abscess    HPI Christina Olsen is a 28 y.o. female.   28 yo female with a c/o abscess on her vaginal area for 2-3 days. Saw PCP today and given oral antibiotic (clindamycin) and was referred to GYN. States yesterday area drained a little bit of pus. Denies any fevers or chills.   The history is provided by the patient.  Abscess    Past Medical History:  Diagnosis Date  . Chlamydia   . Hidradenitis suppurativa   . Trichimoniasis   . Urinary tract infection     Patient Active Problem List   Diagnosis Date Noted  . Pregnancy 09/05/2015  . Normal labor 09/04/2015  . Abortion, missed   . Status post primary low transverse cesarean section 11/03/2012  . Uterine size date discrepancy 11/02/2012  . Backache 11/02/2012  . Vaginal yeast infection 07/18/2012  . 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 Supervision of normal subsequent pregnancy 05/24/2012  . Obesity complicating pregnancy, childbirth, or puerperium, antepartum 04/25/2012    Past Surgical History:  Procedure Laterality Date  . CESAREAN SECTION N/A 11/02/2012   Procedure: CESAREAN SECTION-Baby boy @2327  Apgar 9/9;  Surgeon: Michael Litter, MD;  Location: [facility];  Service: Obstetrics;  Laterality: N/A;  . DILATION AND EVACUATION N/A 09/10/2014   Procedure: DILATATION AND EVACUATION;  Surgeon: Levie Heritage, DO;  Location: [facility];  Service: Gynecology;  Laterality: N/A;  . FRACTURE SURGERY  1999   right collarbone  . WISDOM TOOTH EXTRACTION      OB History    Gravida  5   Para  2   Term  2   Preterm      AB  3   Living  2     SAB  3   TAB      Ectopic      Multiple  0   Live Births  2            Home Medications    Prior to Admission medications   Medication Sig Start Date End Date Taking?  Authorizing Provider  clindamycin (CLEOCIN) 300 MG capsule Take 300 mg by mouth 3 (three) times daily.   Yes [provider]  ibuprofen (ADVIL,MOTRIN) 600 MG tablet Take 1 tablet (600 mg total) by mouth every 6 (six) hours as needed. 08/13/16  Yes Domenick Gong, MD  amoxicillin (AMOXIL) 500 MG capsule Take 1 capsule (500 mg total) by mouth 2 (two) times daily. 08/18/16   Domenick Gong, MD  fluticasone (FLONASE) 50 MCG/ACT nasal spray Place 2 sprays into both nostrils daily. 08/13/16   Domenick Gong, MD  ketorolac (TORADOL) 10 MG tablet Take 1 tablet (10 mg total) by mouth every 8 (eight) hours as needed. 05/04/18   Payton Mccallum, MD  Multiple Vitamins-Minerals (MULTIVITAMIN WITH MINERALS) tablet Take 1 tablet by mouth daily.    [provider]    Family History Family History  Problem Relation Age of Onset  . Hypertension Mother   . Bipolar disorder Mother   . Other Father        unknown medical history  . Hypertension Maternal Aunt   . Bipolar disorder Maternal Aunt   . Schizophrenia Maternal Aunt   . OCD Maternal Aunt   . Hypertension Maternal Uncle   .  Hypertension Maternal Grandmother     Social History Social History   Tobacco Use  . Smoking status: Former Smoker    Last attempt to quit: 05/04/2008    Years since quitting: 10.0  . Smokeless tobacco: Never Used  . Tobacco comment: black and mild  Substance Use Topics  . Alcohol use: Yes    Comment: rarely  . Drug use: No     Allergies   Metronidazole; Shellfish-derived products; and Sulfa antibiotics   Review of Systems Review of Systems   Physical Exam Triage Vital Signs ED Triage Vitals  Enc Vitals Group     BP 05/04/18 1259 113/79     Pulse Rate 05/04/18 1259 82     Resp 05/04/18 1259 16     Temp 05/04/18 1259 98.3 F (36.8 C)     Temp Source 05/04/18 1259 Oral     SpO2 05/04/18 1259 99 %     Weight 05/04/18 1258 211 lb (95.7 kg)     Height 05/04/18 1258  (1.676 m)      Head Circumference --      Peak Flow --      Pain Score 05/04/18 1258 10     Pain Loc --      Pain Edu? --      Excl. in [place]? --    No data found.  Updated Vital Signs BP 113/79 (BP Location: Left Arm)   Pulse 82   Temp 98.3 F (36.8 C) (Oral)   Resp 16   Ht  (1.676 m)   Wt 95.7 kg   LMP 04/20/2018 (Approximate)   SpO2 99%   BMI 34.06 kg/m   Visual Acuity Right Eye Distance:   Left Eye Distance:   Bilateral Distance:    Right Eye Near:   Left Eye Near:    Bilateral Near:     Physical Exam Vitals signs and nursing note reviewed.  Constitutional:      General: She is not in acute distress.    Appearance: She is not toxic-appearing or diaphoretic.  Genitourinary:      Comments: 1.5cm boil on left lower vulvar area; slight purulent drainage Neurological:     Mental Status: She is alert.      UC Treatments / Results  Labs (all labs ordered are listed, but only abnormal results are displayed) Labs Reviewed - No data to display  EKG None  Radiology No results found.  Procedures Procedures (including critical care time)  Medications Ordered in UC Medications - No data to display  Initial Impression / Assessment and Plan / UC Course  I have reviewed the triage vital signs and the nursing notes.  Pertinent labs & imaging results that were available during my care of the patient were reviewed by me and considered in my medical decision making (see chart for details).      Final Clinical Impressions(s) / UC Diagnoses   Final diagnoses:  Vulvar boil     Discharge Instructions     Warm compresses Start antibiotic    ED Prescriptions    Medication Sig Dispense Auth. Provider   HYDROcodone-acetaminophen (NORCO/VICODIN) 5-325 MG tablet  (Status: Discontinued) 1-2 tabs po q 8 hours prn 8 tablet Scotty Weigelt, MD   ketorolac (TORADOL) 10 MG tablet Take 1 tablet (10 mg total) by mouth every 8 (eight) hours as needed. 10 tablet Payton Mccallum,  MD      1. diagnosis reviewed with patient 2. rx as per orders above;  reviewed possible side effects, interactions, risks and benefits  3. Recommend supportive treatment with warm compresses 4. Follow-up prn if symptoms worsen or don't improve  Controlled Substance Prescriptions [place] Controlled Substance Registry consulted? Not Applicable   Payton Mccallum, MD 05/04/18 1345

## 2018-05-04 NOTE — Discharge Instructions (Addendum)
Warm compresses Start antibiotic

## 2018-12-13 ENCOUNTER — Other Ambulatory Visit: Payer: Self-pay

## 2018-12-13 ENCOUNTER — Ambulatory Visit (INDEPENDENT_AMBULATORY_CARE_PROVIDER_SITE_OTHER): Payer: BC Managed Care – PPO

## 2018-12-13 ENCOUNTER — Encounter: Payer: Self-pay | Admitting: Emergency Medicine

## 2018-12-13 ENCOUNTER — Ambulatory Visit
Admission: EM | Admit: 2018-12-13 | Discharge: 2018-12-13 | Disposition: A | Discharge status: Home / Self Care | Payer: BC Managed Care – PPO | Attending: Emergency Medicine | Admitting: Emergency Medicine

## 2018-12-13 DIAGNOSIS — S161XXA Strain of muscle, fascia and tendon at neck level, initial encounter: Secondary | ICD-10-CM

## 2018-12-13 DIAGNOSIS — S39012A Strain of muscle, fascia and tendon of lower back, initial encounter: Secondary | ICD-10-CM

## 2018-12-13 MED ORDER — METAXALONE 800 MG PO TABS: 800.0000 mg | ORAL_TABLET | Freq: Three times a day (TID) | ORAL | 0 refills | Status: DC

## 2018-12-13 MED ORDER — MELOXICAM 15 MG PO TABS: 15.0000 mg | ORAL_TABLET | Freq: Every day | ORAL | 0 refills | Status: DC
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
## 2018-12-13 NOTE — Discharge Instructions (Signed)
Avoid symptoms as much as possible.Use  Caution while taking muscle relaxers.  Do not perform activities requiring concentration or judgment and do not drive.  Follow-up with your chiropractor

## 2019-02-21 ENCOUNTER — Other Ambulatory Visit: Payer: Self-pay

## 2019-02-21 ENCOUNTER — Ambulatory Visit
Admission: EM | Admit: 2019-02-21 | Discharge: 2019-02-21 | Disposition: A | Discharge status: Home / Self Care | Payer: BC Managed Care – PPO | Attending: Urgent Care | Admitting: Urgent Care

## 2019-02-21 DIAGNOSIS — H6592 Unspecified nonsuppurative otitis media, left ear: Secondary | ICD-10-CM

## 2019-02-21 DIAGNOSIS — G43009 Migraine without aura, not intractable, without status migrainosus: Secondary | ICD-10-CM

## 2019-02-21 MED ORDER — KETOROLAC TROMETHAMINE 10 MG PO TABS: 10.0000 mg | ORAL_TABLET | Freq: Three times a day (TID) | ORAL | 0 refills | Status: DC | PRN

## 2019-02-21 MED ORDER — DEXAMETHASONE SODIUM PHOSPHATE 10 MG/ML IJ SOLN
10.0000 mg | Freq: Once | INTRAMUSCULAR | Status: AC
  Administered 2019-02-21: 13:00:00 10 mg via INTRAMUSCULAR

## 2019-02-21 MED ORDER — KETOROLAC TROMETHAMINE 30 MG/ML IJ SOLN
30.0000 mg | Freq: Once | INTRAMUSCULAR | Status: AC
Start: 2019-02-21 — End: 2019-02-21
  Administered 2019-02-21: 13:00:00 30 mg via INTRAMUSCULAR

## 2019-02-21 NOTE — Discharge Instructions (Addendum)
It was very nice seeing you today in clinic. Thank you for entrusting me with your care.   You were treated with Toradol and Dexamethasone in clinic for presumed migraine. I will send you home with some Toradol to use as needed. Make sure you are staying hydrated. You has fluid behind your LEFT ear. I recommend taking some Zyrtec or Sudafed to help dry up the fluid, as it could be contributing to your headache.   COVID test is pending with another site - need to stay home from work until that has resulted as negative.   Make arrangements to follow up with your regular doctor in 1 week for re-evaluation if not improving. If your symptoms/condition worsens, please seek follow up care either here or in the ER. Please remember, our [facility] providers are "right here with you" when you need [place].   Again, it was my pleasure to take care of you today. Thank you for choosing our clinic. I hope that you start to feel better quickly.   Honor Loh, MSN, APRN, FNP-C, CEN Advanced Practice Provider [facility]

## 2019-02-21 NOTE — ED Triage Notes (Signed)
Pt. States she has had a throbbing headache for 3 days now.

## 2019-02-22 NOTE — ED Provider Notes (Signed)
[place], [place]   Name: Christina Olsen DOB: 19-Aug-1990 MRN: [id number] CSN: [id number] PCP: [facility]  Arrival date and time:  02/21/19 1233  Chief Complaint:  Headache   NOTE: Prior to seeing the patient today, I have reviewed the triage nursing documentation and vital signs. Clinical staff has updated patient's PMH/PSHx, current medication list, and drug allergies/intolerances to ensure comprehensive history available to assist in medical decision making.   History:   HPI: Christina Olsen is a 28 y.o. female who presents today with complaints of a throbbing headache that has been persistent for the last 3 days. She denies a history of migraines. Headache is mainly on the LEFT side of her head; behind her eye. She denies any preceding aura. No nausea, vomiting, or vertiginous symptoms. She has not appreciated any weakness, changes in mentation, or visual changes. She endorses photophobia. Patient denies history of seasonal allergies. Patient has not been sick recently, nor has she been in close contact with anyone known to be ill. Out of concern for her own personal health she reports that she was tested for SARS-CoV-2 (novel coronavirus) at the [facility] Department earlier today; results pending. In efforts to conservatively manage her symptoms at home, the patient notes that she has used Excedrine, which has not helped to improve her symptoms.   Past Medical History:  Diagnosis Date  . Chlamydia   . Hidradenitis suppurativa   . Trichimoniasis   . Urinary tract infection     Past Surgical History:  Procedure Laterality Date  . CESAREAN SECTION N/A 11/02/2012   Procedure: CESAREAN SECTION-Baby boy @2327  Apgar 9/9;  Surgeon: Betsy Coder, MD;  Location: [facility];  Service: Obstetrics;  Laterality: N/A;  . DILATION AND EVACUATION N/A 09/10/2014   Procedure: DILATATION AND EVACUATION;  Surgeon: Truett Mainland, DO;  Location: [facility];  Service:  Gynecology;  Laterality: N/A;  . FRACTURE SURGERY  1999   right collarbone  . WISDOM TOOTH EXTRACTION      Family History  Problem Relation Age of Onset  . Hypertension Mother   . Bipolar disorder Mother   . Other Father        unknown medical history  . Hypertension Maternal Aunt   . Bipolar disorder Maternal Aunt   . Schizophrenia Maternal Aunt   . OCD Maternal Aunt   . Hypertension Maternal Uncle   . Hypertension Maternal Grandmother     Social History   Tobacco Use  . Smoking status: Former Smoker    Quit date: 05/04/2008    Years since quitting: 10.8  . Smokeless tobacco: Never Used  . Tobacco comment: black and mild  Substance Use Topics  . Alcohol use: Yes    Comment: rarely  . Drug use: No    Patient Active Problem List   Diagnosis Date Noted  . Pregnancy 09/05/2015  . Normal labor 09/04/2015  . Abortion, missed   . Status post primary low transverse cesarean section 11/03/2012  . Uterine size date discrepancy 11/02/2012  . Backache 11/02/2012  . Vaginal yeast infection 07/18/2012  . 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 Supervision of normal subsequent pregnancy 05/24/2012  . Obesity complicating pregnancy, childbirth, or puerperium, antepartum 04/25/2012    Home Medications:    No outpatient medications have been marked as taking for the 02/21/19 encounter [facility] Encounter).    Allergies:   Metronidazole, Shellfish-derived products, Sulfa antibiotics, and Vicodin [hydrocodone-acetaminophen]  Review of Systems (ROS): Review  of Systems  Constitutional: Negative for chills and fever.  HENT: Negative for congestion, postnasal drip, rhinorrhea, sinus pressure, sinus pain and sore throat.   Eyes: Positive for photophobia. Negative for pain, discharge, redness and visual disturbance.  Respiratory: Negative for cough and shortness of breath.   Cardiovascular: Negative for chest pain and palpitations.  Gastrointestinal:  Negative for nausea and vomiting.  Musculoskeletal: Negative for gait problem, myalgias, neck pain and neck stiffness.  Skin: Negative for color change, pallor and rash.  Neurological: Positive for headaches. Negative for dizziness, tremors, seizures, syncope, speech difficulty, weakness and numbness.  All other systems reviewed and are negative.    Vital Signs: Today's Vitals   02/21/19 1243 02/21/19 1245 02/21/19 1352  BP:  124/80   Pulse:  68   Resp:  19   Temp:  98.3 F (36.8 C)   TempSrc:  Oral   SpO2:  99%   Weight: 190 lb (86.2 kg)    PainSc: 10-Worst pain ever  10-Worst pain ever    Physical Exam: Physical Exam  Constitutional: She is oriented to person, place, and time and well-developed, well-nourished, and in no distress.  HENT:  Head: Normocephalic and atraumatic.  Right Ear: Hearing and tympanic membrane normal.  Left Ear: Hearing normal. Tympanic membrane is bulging. Tympanic membrane is not injected and not erythematous. A middle ear effusion (serous) is present.  Nose: Nose normal.  Mouth/Throat: Uvula is midline, oropharynx is clear and moist and mucous membranes are normal.  Eyes: Pupils are equal, round, and reactive to light. EOM are normal.  Neck: No tracheal deviation present.  Cardiovascular: Normal rate, regular rhythm, normal heart sounds and intact distal pulses. Exam reveals no gallop and no friction rub.  No murmur heard. Pulmonary/Chest: Effort normal and breath sounds normal. No respiratory distress. She has no wheezes. She has no rales.  Musculoskeletal:     Cervical back: Normal range of motion and neck supple.  Neurological: She is alert and oriented to person, place, and time. She has normal sensation, normal strength, normal reflexes and intact cranial nerves. Gait normal.  Skin: Skin is warm and dry. No rash noted.  Psychiatric: Mood, memory, affect and judgment normal.  Nursing note and vitals reviewed.   Urgent Care Treatments /  Results:   No orders of the defined types were placed in this encounter.   LABS: PLEASE NOTE: all labs that were ordered this encounter are listed, however only abnormal results are displayed. Labs Reviewed - No data to display  EKG: -None  RADIOLOGY: No results found.  PROCEDURES: Procedures  MEDICATIONS RECEIVED THIS VISIT: Medications  ketorolac (TORADOL) 30 MG/ML injection 30 mg (30 mg Intramuscular Given 02/21/19 1316)  dexamethasone (DECADRON) injection 10 mg (10 mg Intramuscular Given 02/21/19 1317)    PERTINENT CLINICAL COURSE NOTES/UPDATES:   Initial Impression / Assessment and Plan / Urgent Care Course:  Pertinent labs & imaging results that were available during my care of the patient were personally reviewed by me and considered in my medical decision making (see lab/imaging section of note for values and interpretations).  Clovis FredricksonLarissa Shela LeffShimone Haas is a 28 y.o. female who [facility] with complaints of Headache   Patient is well appearing overall in clinic today. She does not appear to be in any acute distress. Presenting symptoms (see HPI) and exam as documented above. Exam and symptoms consistent with typical migraine headache. She denies history of migraines. Trigger likely related to allergies or early sinus infection.  LEFT ear with serous MEE. Patient treated in clinic with ketorolac 30 mg IM and dexamethasone 10 mg IM, which improved her headache. Patient encouraged to rest and ensure adequate hydration. Will send in prescription for a supply of ketorolac tablets for her to use on a PRN basis. Recommended short term addition of cetirizine or pseudoephedrine to help dry up MEE. Encouraged to avoid forceful nose blowing to prevent worsening of current effusion.   Discussed that, until ruled out with confirmatory lab testing, SARS-CoV-2 remains part of the differential. Her testing is pending at this time with an outside agency at this time.  She was advised to self quarantine, per [facility] DHHS guidelines, until negative results received. These measures are being implemented out of an abundance of caution to prevent transmission and spread during the current SARS-CoV-2 pandemic. Current clinical condition warrants patient being out of work in order to quarantine while waiting for testing results. She was provided with the appropriate documentation to provide to her place of employment that will allow for her to RTW on 02/24/2019 with no restrictions. RTW is contingent on her SARS-CoV-2 test results being reviewed as negative.   Discussed follow up with primary care physician in 1 week for re-evaluation. I have reviewed the follow up and strict return precautions for any new or worsening symptoms. Patient is aware of symptoms that would be deemed urgent/emergent, and would thus require further evaluation either here or in the emergency department. At the time of discharge, she verbalized understanding and consent with the discharge plan as it was reviewed with her. All questions were fielded by provider and/or clinic staff prior to patient discharge.    Final Clinical Impressions / Urgent Care Diagnoses:   Final diagnoses:  Migraine without aura and without status migrainosus, not intractable  MEE (middle ear effusion), left    New Prescriptions:  [place] Controlled Substance Registry consulted? Not Applicable  Meds ordered this encounter  Medications  . ketorolac (TORADOL) 30 MG/ML injection 30 mg  . dexamethasone (DECADRON) injection 10 mg  . ketorolac (TORADOL) 10 MG tablet    Sig: Take 1 tablet (10 mg total) by mouth every 8 (eight) hours as needed.    Dispense:  15 tablet    Refill:  0    Recommended Follow up Care:  Patient encouraged to follow up with the following provider within the specified time frame, or sooner as dictated by the severity of her symptoms. As always, she was instructed that for any urgent/emergent care needs,  she should seek care either here or in the emergency department for more immediate evaluation.  Follow-up Information    [place] In 1 week.   Why: General reassessment of symptoms if not improving Contact information: [address] [place] [address] [phone]         NOTE: This note was prepared using Dragon dictation software along with smaller phrase technology. Despite my best ability to proofread, there is the potential that transcriptional errors may still occur from this process, and are completely unintentional.    Verlee Monte, NP 02/22/19 2208

## 2019-12-28 ENCOUNTER — Other Ambulatory Visit: Payer: Self-pay

## 2019-12-28 ENCOUNTER — Ambulatory Visit
Admission: EM | Admit: 2019-12-28 | Discharge: 2019-12-28 | Disposition: A | Discharge status: Home / Self Care | Payer: MEDICAID | Attending: Family Medicine | Admitting: Family Medicine

## 2019-12-28 ENCOUNTER — Encounter: Payer: Self-pay | Admitting: Emergency Medicine

## 2019-12-28 DIAGNOSIS — T7840XA Allergy, unspecified, initial encounter: Secondary | ICD-10-CM | POA: Diagnosis not present

## 2019-12-28 DIAGNOSIS — R220 Localized swelling, mass and lump, head: Secondary | ICD-10-CM

## 2019-12-28 MED ORDER — METHYLPREDNISOLONE SODIUM SUCC 40 MG IJ SOLR
80.0000 mg | Freq: Once | INTRAMUSCULAR | Status: AC
  Administered 2019-12-28: 80 mg via INTRAMUSCULAR

## 2019-12-28 MED ORDER — PREDNISONE 10 MG (21) PO TBPK: ORAL_TABLET | ORAL | 0 refills | Status: DC

## 2019-12-28 MED ORDER — HYDROXYZINE HCL 25 MG PO TABS: 25.0000 mg | ORAL_TABLET | Freq: Three times a day (TID) | ORAL | 0 refills | Status: DC | PRN

## 2019-12-28 NOTE — Discharge Instructions (Signed)
Medication as directed. ° °Take care ° °Dr. Noorah Giammona  °

## 2019-12-28 NOTE — ED Triage Notes (Signed)
Patient c/o facial swelling on the right side of her face and itching that started 2 days ago.  Patient states that she used a new face mask that she saw on TikTok on Friday.  Patient states that her symptoms have gotten worse this morning.  Patient states that she has taken Benadryl.

## 2019-12-28 NOTE — ED Provider Notes (Signed)
[facility]    CSN: [id number] Arrival date & time: 12/28/19  0801  History   Chief Complaint Chief Complaint  Patient presents with   Facial Swelling   HPI  29 year old female presents with the above complaint.  Patient states that she has had ongoing facial swelling and right periorbital swelling.  Mild swelling of the lips. No tongue swelling. This has been going on for the past 2 days.  Started after she used a new facial wash.  She reports associated itching.  No SOB. She has been using Benadryl and Zyrtec without improvement.  No other new exposures.  No other associated symptoms.  No other areas affected.  No other complaints at this time.   Past Medical History:  Diagnosis Date   Chlamydia    Hidradenitis suppurativa    Trichimoniasis    Urinary tract infection    Patient Active Problem List   Diagnosis Date Noted   Pregnancy 09/05/2015   Normal labor 09/04/2015   Abortion, missed    Status post primary low transverse cesarean section 11/03/2012   Uterine size date discrepancy 11/02/2012   Backache 11/02/2012   Vaginal yeast infection 07/18/2012   Hereditary persistence of fetal hemoglobin ([facility]) 05/24/2012   Supervision of normal subsequent pregnancy 05/24/2012   Obesity complicating pregnancy, childbirth, or puerperium, antepartum 04/25/2012   Past Surgical History:  Procedure Laterality Date   CESAREAN SECTION N/A 11/02/2012   Procedure: CESAREAN SECTION-Baby boy @2327  Apgar 9/9;  Surgeon: 11/9, MD;  Location: [facility];  Service: Obstetrics;  Laterality: N/A;   DILATION AND EVACUATION N/A 09/10/2014   Procedure: DILATATION AND EVACUATION;  Surgeon: 11/11/2014, DO;  Location: [facility];  Service: Gynecology;  Laterality: N/A;   FRACTURE SURGERY  1999   right collarbone   WISDOM TOOTH EXTRACTION      OB History    Gravida  5   Para  2   Term  2   Preterm      AB  3   Living  2     SAB  3   TAB       Ectopic      Multiple  0   Live Births  2            Home Medications    Prior to Admission medications   Medication Sig Start Date End Date Taking? Authorizing Provider  hydrOXYzine (ATARAX/VISTARIL) 25 MG tablet Take 1 tablet (25 mg total) by mouth every 8 (eight) hours as needed. 12/28/19   12/30/19, DO  predniSONE (STERAPRED UNI-PAK 21 TAB) 10 MG (21) TBPK tablet 6 tablets on day 1; decrease by 1 tablet daily until gone. 12/28/19   12/30/19, DO  fluticasone (FLONASE) 50 MCG/ACT nasal spray Place 2 sprays into both nostrils daily. 08/13/16 12/13/18  02/12/19, MD    Family History Family History  Problem Relation Age of Onset   Hypertension Mother    Bipolar disorder Mother    Other Father        unknown medical history   Hypertension Maternal Aunt    Bipolar disorder Maternal Aunt    Schizophrenia Maternal Aunt    OCD Maternal Aunt    Hypertension Maternal Uncle    Hypertension Maternal Grandmother     Social History Social History   Tobacco Use   Smoking status: Former Smoker    Quit date: 05/04/2008    Years since quitting: 11.6   Smokeless tobacco:  Never Used   Tobacco comment: black and mild  Vaping Use   Vaping Use: Never used  Substance Use Topics   Alcohol use: Yes    Comment: rarely   Drug use: No     Allergies   Metronidazole, Shellfish-derived products, Sulfa antibiotics, and Vicodin [hydrocodone-acetaminophen]   Review of Systems Review of Systems  HENT: Positive for facial swelling.   Skin:       Itching.    Physical Exam Triage Vital Signs ED Triage Vitals  Enc Vitals Group     BP 12/28/19 0815 123/90     Pulse Rate 12/28/19 0815 85     Resp 12/28/19 0815 14     Temp 12/28/19 0815 98.6 F (37 C)     Temp Source 12/28/19 0815 Oral     SpO2 12/28/19 0815 99 %     Weight 12/28/19 0812 204 lb (92.5 kg)     Height 12/28/19 0812 5\' 6"  (1.676 m)     Head Circumference --      Peak Flow --      Pain  Score 12/28/19 0812 0     Pain Loc --      Pain Edu? --      Excl. in GC? --    Updated Vital Signs BP 123/90 (BP Location: Left Arm)    Pulse 85    Temp 98.6 F (37 C) (Oral)    Resp 14    Ht 5\' 6"  (1.676 m)    Wt 92.5 kg    LMP 12/21/2019 (Approximate)    SpO2 99%    BMI 32.93 kg/m   Visual Acuity Right Eye Distance:   Left Eye Distance:   Bilateral Distance:    Right Eye Near:   Left Eye Near:    Bilateral Near:     Physical Exam Vitals and nursing note reviewed.  Constitutional:      General: She is not in acute distress.    Appearance: Normal appearance. She is not ill-appearing.  HENT:     Head: Normocephalic and atraumatic.  Eyes:     General:        Right eye: No discharge.        Left eye: No discharge.     Conjunctiva/sclera: Conjunctivae normal.  Cardiovascular:     Rate and Rhythm: Normal rate and regular rhythm.     Heart sounds: No murmur heard.   Pulmonary:     Effort: Pulmonary effort is normal.     Breath sounds: Normal breath sounds. No wheezing, rhonchi or rales.  Neurological:     Mental Status: She is alert.  Psychiatric:        Mood and Affect: Mood normal.        Behavior: Behavior normal.    UC Treatments / Results  Labs (all labs ordered are listed, but only abnormal results are displayed) Labs Reviewed - No data to display  EKG   Radiology No results found.  Procedures Procedures (including critical care time)  Medications Ordered in UC Medications  methylPREDNISolone sodium succinate (SOLU-MEDROL) 40 mg/mL injection 80 mg (has no administration in time range)    Initial Impression / Assessment and Plan / UC Course  I have reviewed the triage vital signs and the nursing notes.  Pertinent labs & imaging results that were available during my care of the patient were reviewed by me and considered in my medical decision making (see chart for details).    29 year old  female presents with an allergic reaction. Solumedrol given  here today. Treating with prednisone taper, hydroxyzine, zyrtec.  Final Clinical Impressions(s) / UC Diagnoses   Final diagnoses:  Facial swelling  Allergic reaction, initial encounter     Discharge Instructions     Medication as directed.  Take care  Dr. Adriana Simas    ED Prescriptions    Medication Sig Dispense Auth. Provider   predniSONE (STERAPRED UNI-PAK 21 TAB) 10 MG (21) TBPK tablet 6 tablets on day 1; decrease by 1 tablet daily until gone. 21 tablet Valary Manahan G, DO   hydrOXYzine (ATARAX/VISTARIL) 25 MG tablet Take 1 tablet (25 mg total) by mouth every 8 (eight) hours as needed. 30 tablet Tommie Sams, DO     PDMP not reviewed this encounter.   Everlene Other [place], [place] 12/28/19 [phone]

## 2020-11-29 ENCOUNTER — Other Ambulatory Visit: Payer: Self-pay

## 2020-11-29 ENCOUNTER — Ambulatory Visit
Admission: EM | Admit: 2020-11-29 | Discharge: 2020-11-29 | Disposition: A | Discharge status: Home / Self Care | Payer: 59 | Attending: Emergency Medicine | Admitting: Emergency Medicine

## 2020-11-29 DIAGNOSIS — M62838 Other muscle spasm: Secondary | ICD-10-CM

## 2020-11-29 DIAGNOSIS — S39012A Strain of muscle, fascia and tendon of lower back, initial encounter: Secondary | ICD-10-CM | POA: Diagnosis not present

## 2020-11-29 DIAGNOSIS — S161XXA Strain of muscle, fascia and tendon at neck level, initial encounter: Secondary | ICD-10-CM

## 2020-11-29 DIAGNOSIS — F411 Generalized anxiety disorder: Secondary | ICD-10-CM

## 2020-11-29 MED ORDER — TIZANIDINE HCL 4 MG PO TABS: 4.0000 mg | ORAL_TABLET | Freq: Three times a day (TID) | ORAL | 0 refills | Status: DC | PRN

## 2020-11-29 MED ORDER — HYDROXYZINE HCL 50 MG PO TABS: 50.0000 mg | ORAL_TABLET | Freq: Four times a day (QID) | ORAL | 0 refills | Status: DC | PRN

## 2020-11-29 MED ORDER — IBUPROFEN 600 MG PO TABS: 600.0000 mg | ORAL_TABLET | Freq: Four times a day (QID) | ORAL | 0 refills | Status: DC | PRN

## 2020-11-29 NOTE — Discharge Instructions (Addendum)
People tend to feel worse over the next several days, but most people are back to normal in 1 week. A small number of people will have persistent pain for up to six weeks. Take the 600 mg of ibuprofen combined with 1000 mg of tylenol 4 times a day. This is an extremely effective combination for pain.  Epsom salt soaks, gentle massage.  Vistaril for anxiety.  You can also try bedtime Yogi tea with valerian root in it.  Keep an eye on your urine.   Go to www.goodrx.com  or www.costplusdrugs.com to look up your medications. This will give you a list of where you can find your prescriptions at the most affordable prices. Or ask the pharmacist what the cash price is, or if they have any other discount programs available to help make your medication more affordable. This can be less expensive than what you would pay with insurance.
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
## 2020-11-30 ENCOUNTER — Telehealth: Payer: Self-pay
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
## 2021-03-13 ENCOUNTER — Other Ambulatory Visit: Payer: Self-pay

## 2021-03-13 ENCOUNTER — Encounter: Payer: Self-pay | Admitting: Emergency Medicine

## 2021-03-13 ENCOUNTER — Ambulatory Visit
Admission: EM | Admit: 2021-03-13 | Discharge: 2021-03-13 | Disposition: A | Discharge status: Home / Self Care | Payer: 59

## 2021-03-13 DIAGNOSIS — O219 Vomiting of pregnancy, unspecified: Secondary | ICD-10-CM

## 2021-03-13 NOTE — ED Triage Notes (Addendum)
Pt c/o nausea, vomiting, weakness. Pt is [redacted] weeks pregnant. She states her OB gave her promethazine yesterday but has only made her sleep. Pt states she passed out last night for about 30 seconds. Pt is tearful during triage.

## 2021-03-13 NOTE — ED Notes (Signed)
Patient is being discharged from the Urgent Care and sent to the Emergency Department via private vehicle . Per Clancy Gourd, NP, patient is in need of higher level of care due to pregnancy and syncope. Patient is aware and verbalizes understanding of plan of care.  Vitals:   03/13/21 0934  BP: 114/82  Pulse: 96  Resp: 18  Temp: 98.6 F (37 C)  SpO2: 100%

## 2021-03-13 NOTE — ED Provider Notes (Signed)
[facility]    CSN: [id number] Arrival date & time: 03/13/21  0849      History   Chief Complaint Chief Complaint  Patient presents with   Emesis    HPI Christina Olsen is a 31 y.o. female.   Pt c/o nausea, vomiting, weakness. Pt is [redacted] weeks pregnant. She states her OB gave her promethazine yesterday but has only made her sleep. Pt states she "passed out last night for about 30 seconds" Pt states she has "HG". Pt states her OB is in [place], vomits every day.  The history is provided by the patient. No language interpreter was used.   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   Supervision of normal subsequent pregnancy [id number]   Obesity complicating pregnancy, childbirth, or puerperium, antepartum 04/25/2012    Past Surgical History:  Procedure Laterality Date   CESAREAN SECTION N/A 11/02/2012   Procedure: CESAREAN SECTION-Baby boy @2327  Apgar 9/9;  Surgeon: Betsy Coder, MD;  Location: [facility];  Service: Obstetrics;  Laterality: N/A;   DILATION AND EVACUATION N/A 09/10/2014   Procedure: DILATATION AND EVACUATION;  Surgeon: Truett Mainland, DO;  Location: [facility];  Service: Gynecology;  Laterality: N/A;   FRACTURE SURGERY  1999   right collarbone   WISDOM TOOTH EXTRACTION      OB History     Gravida  6   Para  2   Term  2   Preterm      AB  3   Living  2      SAB  3   IAB      Ectopic      Multiple  0   Live Births  2             Home Medications    Prior to Admission medications   Medication Sig Start Date End Date Taking? Authorizing Provider  hydrOXYzine (ATARAX/VISTARIL) 50 MG tablet Take 1 tablet (50 mg total) by mouth every 6 (six) hours as needed for anxiety. May take 2 tabs at night 11/29/20   Melynda Ripple, MD  ibuprofen (ADVIL) 600 MG tablet Take 1 tablet (600 mg total) by mouth every 6 (six) hours as needed. 11/29/20   Melynda Ripple, MD  promethazine (PHENERGAN) 25 MG tablet promethazine 25 mg tablet    [provider]  tiZANidine (ZANAFLEX) 4 MG tablet Take 1 tablet (4 mg total) by mouth every 8 (eight) hours as needed for muscle spasms. 11/29/20   Melynda Ripple, MD  fluticasone (FLONASE) 50 MCG/ACT nasal spray Place 2 sprays into both nostrils daily. 08/13/16 12/13/18  Melynda Ripple, MD    Family History Family History  Problem Relation Age of Onset   Hypertension Mother    Bipolar disorder Mother    Other Father        unknown medical history   Hypertension Maternal Aunt    Bipolar disorder Maternal Aunt    Schizophrenia Maternal Aunt  OCD Maternal Aunt    Hypertension Maternal Uncle    Hypertension Maternal Grandmother     Social History Social History   Tobacco Use   Smoking status: Former    Types: Cigarettes    Quit date: 05/04/2008    Years since quitting: 12.8   Smokeless tobacco: Never   Tobacco comments:    black and mild  Vaping Use   Vaping Use: Never used  Substance Use Topics   Alcohol use: Yes    Comment: rarely   Drug use: No     Allergies   Metronidazole, Shellfish-derived products, Sulfa antibiotics, and Vicodin [hydrocodone-acetaminophen]   Review of Systems Review of Systems  Constitutional:  Negative for fever.  Gastrointestinal:  Positive for nausea and vomiting. Negative for abdominal pain and diarrhea.  Genitourinary:  Negative for dysuria, vaginal bleeding and vaginal discharge.  All other systems reviewed and are  negative.   Physical Exam Triage Vital Signs ED Triage Vitals  Enc Vitals Group     BP 03/13/21 0934 114/82     Pulse Rate 03/13/21 0934 96     Resp 03/13/21 0934 18     Temp 03/13/21 0934 98.6 F (37 C)     Temp Source 03/13/21 0934 Oral     SpO2 03/13/21 0934 100 %     Weight 03/13/21 0931 190 lb 0.6 oz (86.2 kg)     Height 03/13/21 0931 5\' 6"  (1.676 m)     Head Circumference --      Peak Flow --      Pain Score 03/13/21 0931 0     Pain Loc --      Pain Edu? --      Excl. in [place]? --    No data found.  Updated Vital Signs BP 114/82 (BP Location: Left Arm)    Pulse 96    Temp 98.6 F (37 C) (Oral)    Resp 18    Ht 5\' 6"  (1.676 m)    Wt 190 lb 0.6 oz (86.2 kg)    LMP 01/13/2021    SpO2 100%    BMI 30.67 kg/m   Visual Acuity Right Eye Distance:   Left Eye Distance:   Bilateral Distance:    Right Eye Near:   Left Eye Near:    Bilateral Near:     Physical Exam Vitals and nursing note reviewed.  Constitutional:      General: She is not in acute distress.    Appearance: She is well-developed and well-groomed.  HENT:     Head: Normocephalic and atraumatic.  Eyes:     Conjunctiva/sclera: Conjunctivae normal.  Cardiovascular:     Rate and Rhythm: Normal rate and regular rhythm.     Heart sounds: No murmur heard. Pulmonary:     Effort: Pulmonary effort is normal. No respiratory distress.     Breath sounds: Normal breath sounds.  Abdominal:     Palpations: Abdomen is soft.     Tenderness: There is no abdominal tenderness.  Musculoskeletal:        General: No swelling.     Cervical back: Neck supple.  Skin:    General: Skin is warm and dry.     Capillary Refill: Capillary refill takes less than 2 seconds.  Neurological:     General: No focal deficit present.     Mental Status: She is alert and oriented to person, place, and time.     GCS: GCS eye subscore is 4. GCS verbal subscore  is 5. GCS motor subscore is 6.  Psychiatric:        Mood and Affect: Affect is  tearful.     Comments: Confrontational with staff.      UC Treatments / Results  Labs (all labs ordered are listed, but only abnormal results are displayed) Labs Reviewed - No data to display  EKG   Radiology No results found.  Procedures Procedures (including critical care time)  Medications Ordered in UC Medications - No data to display  Initial Impression / Assessment and Plan / UC Course  I have reviewed the triage vital signs and the nursing notes.  Pertinent labs & imaging results that were available during my care of the patient were reviewed by me and considered in my medical decision making (see chart for details).     Ddx: Pregnancy, viral illness, Hyperemesis gravidum  Discussed with pt due to pregnancy, may need admission if unable to keep anything down, recommend further evaluation of pt and pregnancy in ER since phenergan isn't working, pt would need additional testing,monitoring for several hours and we aren't able to provide full evaluation in outpt/urgent care setting, went to discuss options with charge nurse, Heather/Brittany tech, went back to room to discuss with pt and notified by security that pt walked out.  Final Clinical Impressions(s) / UC Diagnoses   Final diagnoses:  Nausea and vomiting in pregnancy prior to [redacted] weeks gestation   Discharge Instructions   None    ED Prescriptions   None    PDMP not reviewed this encounter.   Tori Milks, NP [phone]

## 2021-07-05 ENCOUNTER — Encounter: Payer: Self-pay | Admitting: Surgery

## 2021-07-05 ENCOUNTER — Ambulatory Visit (INDEPENDENT_AMBULATORY_CARE_PROVIDER_SITE_OTHER): Payer: Medicaid Other | Admitting: Surgery

## 2021-07-05 VITALS — BP 102/65 | HR 78 | Temp 98.3°F | Ht 66.0 in | Wt 213.0 lb

## 2021-07-05 DIAGNOSIS — L02416 Cutaneous abscess of left lower limb: Secondary | ICD-10-CM | POA: Diagnosis not present

## 2021-07-05 NOTE — Patient Instructions (Addendum)
If you have any concerns or questions, please feel free to call our office. See follow up appointment below.    Hidradenitis Suppurativa Hidradenitis suppurativa is a long-term (chronic) skin disease. It is similar to a severe form of acne, but it affects areas of the body where acne would be unusual, especially areas of the body where skin rubs against skin and becomes moist. These include: Underarms. Groin. Genital area. Buttocks. Upper thighs. Breasts. Hidradenitis suppurativa may start out as small lumps or pimples caused by blocked sweat glands or hair follicles. Pimples may develop into deep sores that break open (rupture) and drain pus. Over time, affected areas of skin may thicken and become scarred. This condition is rare and does not spread from person to person (non-contagious). What are the causes? The exact cause of this condition is not known. It may be related to: Female and female hormones. An overactive disease-fighting system (immune system). The immune system may over-react to blocked hair follicles or sweat glands and cause swelling and pus-filled sores. What increases the risk? You are more likely to develop this condition if you: Are female. Are 11-55 years old. Have a family history of hidradenitis suppurativa. Have a personal history of acne. Are overweight. Smoke. Take the medicine lithium. What are the signs or symptoms? The first symptoms are usually painful bumps in the skin, similar to pimples. The condition may get worse over time (progress), or it may only cause mild symptoms. If the disease progresses, symptoms may include: Skin bumps getting bigger and growing deeper into the skin. Bumps rupturing and draining pus. Itchy, infected skin. Skin getting thicker and scarred. Tunnels under the skin (fistulas) where pus drains from a bump. Pain during daily activities, such as pain during walking if your groin area is affected. Emotional problems, such as  stress or depression. This condition may affect your appearance and your ability or willingness to wear certain clothes or do certain activities. How is this diagnosed? This condition is diagnosed by a health care provider who specializes in skin diseases (dermatologist). You may be diagnosed based on: Your symptoms and medical history. A physical exam. Testing a pus sample for infection. Blood tests. How is this treated? Your treatment will depend on how severe your symptoms are. The same treatment will not work for everybody with this condition. You may need to try several treatments to find what works best for you. Treatment may include: Cleaning and bandaging (dressing) your wounds as needed. Lifestyle changes, such as new skin care routines. Taking medicines, such as: Antibiotics. Acne medicines. Medicines to reduce the activity of the immune system. A diabetes medicine (metformin). Birth control pills, for women. Steroids to reduce swelling and pain. Working with a mental health care provider, if you experience emotional distress due to this condition. If you have severe symptoms that do not get better with medicine, you may need surgery. Surgery may involve: Using a laser to clear the skin and remove hair follicles. Opening and draining deep sores. Removing the areas of skin that are diseased and scarred. Follow these instructions at home: Medicines  Take over-the-counter and prescription medicines only as told by your health care provider. If you were prescribed an antibiotic medicine, take it as told by your health care provider. Do not stop taking the antibiotic even if your condition improves. Skin care If you have open wounds, cover them with a clean dressing as told by your health care provider. Keep wounds clean by washing them gently with   soap and water when you bathe. Do not shave the areas where you get hidradenitis suppurativa. Do not wear deodorant. Wear  loose-fitting clothes. Try to avoid getting overheated or sweaty. If you get sweaty or wet, change into clean, dry clothes as soon as you can. To help relieve pain and itchiness, cover sore areas with a warm, clean washcloth (warm compress) for 5-10 minutes as often as needed. If told by your health care provider, take a bleach bath twice a week: Fill your bathtub halfway with water. Pour in  cup of unscented household bleach. Soak in the tub for 5-10 minutes. Only soak from the neck down. Avoid water on your face and hair. Shower to rinse off the bleach from your skin. General instructions Learn as much as you can about your disease so that you have an active role in your treatment. Work closely with your health care provider to find treatments that work for you. If you are overweight, work with your health care provider to lose weight as recommended. Do not use any products that contain nicotine or tobacco, such as cigarettes and e-cigarettes. If you need help quitting, ask your health care provider. If you struggle with living with this condition, talk with your health care provider or work with a mental health care provider as recommended. Keep all follow-up visits as told by your health care provider. This is important. Where to find more information Hidradenitis Suppurativa Foundation, Inc.: https://www.hs-foundation.org/ American Academy of Dermatology: https://www.aad.org Contact a health care provider if you have: A flare-up of hidradenitis suppurativa. A fever or chills. Trouble controlling your symptoms at home. Trouble doing your daily activities because of your symptoms. Trouble dealing with emotional problems related to your condition. Summary Hidradenitis suppurativa is a long-term (chronic) skin disease. It is similar to a severe form of acne, but it affects areas of the body where acne would be unusual. The first symptoms are usually painful bumps in the skin, similar to  pimples. The condition may only cause mild symptoms, or it may get worse over time (progress). If you have open wounds, cover them with a clean dressing as told by your health care provider. Keep wounds clean by washing them gently with soap and water when you bathe. Besides skin care, treatment may include medicines, laser treatment, and surgery. This information is not intended to replace advice given to you by your health care provider. Make sure you discuss any questions you have with your health care provider. Document Revised: 12/17/2019 Document Reviewed: 12/17/2019 Elsevier Patient Education  2023 [employer].  

## 2021-07-06 NOTE — Progress Notes (Signed)
Patient ID: Christina Olsen, female   DOB: 27-Apr-1990, 31 y.o.   MRN: [id number] ? ?HPI ?Christina Olsen is a 31 y.o. female seen in consultation as an urgent referral from Mrs Susann Givens PA for a left thigh abscess.  She reports a few day history of left thigh pain that is moderate intensity worsening when she moves and presses on the area..  The pain is sharp.  No specific alleviating factors.  She is currently pregnant approximately 24 weeks.  No significant issues recently with pregnancy.  She does feel the baby move.  She denies any fevers any chills.  She does report some history of hidradenitis in the past. ?She is able to perform more than 4 METS of activity without any shortness of breath or chest pain.  She denies any blood thinners. ?No current images are available at this time. ?Recent labs showed a normal CBC. ? ? ?HPI ? ?Past Medical History:  ?Diagnosis Date  ? Chlamydia   ? Hidradenitis suppurativa   ? Trichimoniasis   ? Urinary tract infection   ? ? ?Past Surgical History:  ?Procedure Laterality Date  ? CESAREAN SECTION N/A 11/02/2012  ? Procedure: CESAREAN SECTION-Baby boy @2327  Apgar 9/9;  Surgeon: 11/9, MD;  Location: [facility];  Service: Obstetrics;  Laterality: N/A;  ? DILATION AND EVACUATION N/A 09/10/2014  ? Procedure: DILATATION AND EVACUATION;  Surgeon: 11/11/2014, DO;  Location: [facility];  Service: Gynecology;  Laterality: N/A;  ? FRACTURE SURGERY  1999  ? right collarbone  ? WISDOM TOOTH EXTRACTION    ? ? ?Family History  ?Problem Relation Age of Onset  ? Hypertension Mother   ? Bipolar disorder Mother   ? Other Father   ?     unknown medical history  ? Hypertension Maternal Aunt   ? Bipolar disorder Maternal Aunt   ? Schizophrenia Maternal Aunt   ? OCD Maternal Aunt   ? Hypertension Maternal Uncle   ? Hypertension Maternal Grandmother   ? ? ?Social History ?Social History  ? ?Tobacco Use  ? Smoking status: Former  ?  Types: Cigarettes  ?  Quit date: 05/04/2008  ?  Years  since quitting: 13.1  ? Smokeless tobacco: Never  ? Tobacco comments:  ?  black and mild  ?Vaping Use  ? Vaping Use: Never used  ?Substance Use Topics  ? Alcohol use: Yes  ?  Comment: rarely  ? Drug use: No  ? ? ?Allergies  ?Allergen Reactions  ? Metronidazole   ?  Other reaction(s): Vomiting  ? Shellfish-Derived Products Anaphylaxis and Swelling  ? Sulfa Antibiotics Other (See Comments)  ?  Reaction:  Unknown  ? Vicodin [Hydrocodone-Acetaminophen] Nausea And Vomiting  ? ? ?No current outpatient medications on file.  ? ?No current facility-administered medications for this visit.  ? ? ? ?Review of Systems ?Full ROS  was asked and was negative except for the information on the HPI ? ?Physical Exam ?Blood pressure 102/65, pulse 78, temperature 98.3 ?F (36.8 ?C), temperature source Oral, height 5\' 6"  (1.676 m), weight 213 lb (96.6 kg), last menstrual period 01/13/2021, SpO2 98 %. ?CONSTITUTIONAL: NAD. ?EYES: Pupils are equal, round, are non-icteric. ?EARS, NOSE, MOUTH AND THROAT: The oral mucosa is pink and moist. Hearing is intact to voice. ?LYMPH NODES:  Lymph nodes in the neck are normal. ?RESPIRATORY:  Lungs are clear. There is normal respiratory effort, with equal breath sounds bilaterally, and without pathologic use of accessory muscles. ?CARDIOVASCULAR: Heart is  regular without murmurs, gallops, or rubs. ?GI: The abdomen is  soft, nontender, and nondistended. There are no palpable masses. There is no hepatosplenomegaly. There are normal bowel sounds  ?I am able to palpate the uterus with intrauterine pregnancy ?GU: Rectal deferred.   ?MUSCULOSKELETAL: Normal muscle strength and tone. No cyanosis or edema.   ?SKIN: There is evidence of a fluctuant area on the medial left mid thigh.  There is some erythema and exquisite tenderness to palpation.  There is no evidence of necrotizing infection. ?NEUROLOGIC: Motor and sensation is grossly normal. Cranial nerves are grossly intact. ?PSYCH:  Oriented to person, place  and time. Affect is normal. ? ?Data Reviewed ? ?I have personally reviewed the patient's imaging, laboratory findings and medical records.   ? ?Assessment/Plan ?31 year old female with intrauterine Agnes C 24 weeks now with a complex left thigh abscess.  Discussed with the patient in detail regarding her disease process.  I do recommend incision and drainage of abscess.  Procedure discussed with the patient in detail.  Risk, benefits and possible implications.  I do think that is reasonable to perform the I&D here in the office. ?She is in agreement. ?Please note that I spent greater than 45 minutes in this encounter including counseling the pt, coordinating her care .  Personally reviewing records, placing orders and performing appropriate documentation ? ? ?PROCEDURE NOTE ?Incision and drainage of complex left thigh abscess ?Excisional debridement of skin subcutaneous tissue and fascia measuring 4 cm? ? ?Anesthesia: Lidocaine 1%  w epi ? ?Findings: Left thigh abscess complex ? ?EBL: minimal ? ?complications: None ? ?After informed consent was obtained the patient was placed in the supine position.  She was prepped and draped in the usual sterile fashion and lidocaine 1% with epinephrine was injected.  We used a total of 9 cc.  Using the 11 blade knife an elliptical incision was created and were able to drain about 4 cc of pus.  Were able to debride the subcutaneous tissues down to the fascia with the knife.  All the loculations were broken down.  The wound was irrigated and half inch packing was placed.  Hemostasis was obtained with pressure.  Sterile dressing was applied.  No complications ? ? ?Sterling Big, MD FACS ?General Surgeon ?07/06/2021, 7:43 AM ? ?  ?

## 2021-07-20 ENCOUNTER — Encounter: Payer: Medicaid Other | Admitting: Physician Assistant
